# Patient Record
Sex: Female | Born: 1950 | Race: White | Hispanic: No | Marital: Married | Smoking: Former smoker
Health system: Southern US, Community
[De-identification: ages and names within clinical notes are randomized; demographics above are authoritative.]

## PROBLEM LIST (undated history)

## (undated) DIAGNOSIS — G43909 Migraine, unspecified, not intractable, without status migrainosus: Secondary | ICD-10-CM

## (undated) DIAGNOSIS — E785 Hyperlipidemia, unspecified: Secondary | ICD-10-CM

## (undated) DIAGNOSIS — E079 Disorder of thyroid, unspecified: Secondary | ICD-10-CM

## (undated) DIAGNOSIS — D649 Anemia, unspecified: Secondary | ICD-10-CM

## (undated) DIAGNOSIS — N200 Calculus of kidney: Secondary | ICD-10-CM

## (undated) HISTORY — PX: KNEE SURGERY: SHX244

## (undated) HISTORY — DX: Migraine, unspecified, not intractable, without status migrainosus: G43.909

## (undated) HISTORY — DX: Disorder of thyroid, unspecified: E07.9

## (undated) HISTORY — DX: Anemia, unspecified: D64.9

## (undated) HISTORY — DX: Hyperlipidemia, unspecified: E78.5

---

## 1997-08-10 ENCOUNTER — Ambulatory Visit (HOSPITAL_COMMUNITY): Admission: RE | Admit: 1997-08-10 | Discharge: 1997-08-10 | Payer: Self-pay | Admitting: Sports Medicine

## 1999-09-27 ENCOUNTER — Ambulatory Visit (HOSPITAL_COMMUNITY): Admission: RE | Admit: 1999-09-27 | Discharge: 1999-09-27 | Payer: Self-pay | Admitting: Family Medicine

## 1999-09-27 ENCOUNTER — Encounter: Payer: Self-pay | Admitting: Family Medicine

## 2000-02-14 ENCOUNTER — Other Ambulatory Visit: Admission: RE | Admit: 2000-02-14 | Discharge: 2000-02-14 | Payer: Self-pay | Admitting: Obstetrics and Gynecology

## 2000-03-30 ENCOUNTER — Emergency Department (HOSPITAL_COMMUNITY): Admission: EM | Admit: 2000-03-30 | Discharge: 2000-03-30 | Payer: Self-pay | Admitting: Internal Medicine

## 2000-03-30 ENCOUNTER — Encounter: Payer: Self-pay | Admitting: Emergency Medicine

## 2002-01-10 ENCOUNTER — Encounter (INDEPENDENT_AMBULATORY_CARE_PROVIDER_SITE_OTHER): Payer: Self-pay

## 2002-01-10 ENCOUNTER — Ambulatory Visit (HOSPITAL_COMMUNITY): Admission: RE | Admit: 2002-01-10 | Discharge: 2002-01-10 | Payer: Self-pay | Admitting: Gastroenterology

## 2003-06-09 ENCOUNTER — Ambulatory Visit (HOSPITAL_COMMUNITY): Admission: RE | Admit: 2003-06-09 | Discharge: 2003-06-09 | Payer: Self-pay | Admitting: Family Medicine

## 2004-05-03 ENCOUNTER — Emergency Department (HOSPITAL_COMMUNITY): Admission: EM | Admit: 2004-05-03 | Discharge: 2004-05-03 | Payer: Self-pay | Admitting: Emergency Medicine

## 2006-12-29 ENCOUNTER — Other Ambulatory Visit: Admission: RE | Admit: 2006-12-29 | Discharge: 2006-12-29 | Payer: Self-pay | Admitting: Family Medicine

## 2010-01-22 ENCOUNTER — Other Ambulatory Visit
Admission: RE | Admit: 2010-01-22 | Discharge: 2010-01-22 | Payer: Self-pay | Source: Home / Self Care | Admitting: Family Medicine

## 2010-05-31 NOTE — Op Note (Signed)
   [name], Katherine Morton                      ACCOUNT NO.:  [id number]   MEDICAL RECORD NO.:  [id number]                   PATIENT TYPE:  AMB   LOCATION:  ENDO                                 FACILITY:  [facility]   PHYSICIAN:  John C. Madilyn Fireman, M.D.                 DATE OF BIRTH:  [birth year]   DATE OF PROCEDURE:  DATE OF DISCHARGE:                                 OPERATIVE REPORT   PROCEDURE:  Colonoscopy.   INDICATIONS FOR PROCEDURE:  Family history of colon cancer in a first degree  relative in a 60 year old patient with no recent colon screening.   DESCRIPTION OF PROCEDURE:  The patient was placed in the left lateral  decubitus position and placed on the pulse monitor with continuous low flow  oxygen delivered by nasal cannula. She was sedated with 87.5 mg IV fentanyl  and 7 mg of IV Versed.   The Olympus video colonoscope was inserted into the rectum and advanced to  the cecum, confirmed by transillumination of McBurney's point and  visualization of the ileocecal valve and appendiceal orifice.  The prep was  excellent.   The cecum, ascending  and transverse colon all appeared normal with no  masses, polyps, diverticula or other mucosal abnormalities. In the  descending and sigmoid colon there were seen several scattered diverticula  and no other abnormalities. In the distal sigmoid colon there was a 1 cm  sessile polyp which was removed by snare. The remainder of the sigmoid and  the rectum  appeared normal down to the anus where retroflexed view revealed  no obvious internal hemorrhoids.   The colonoscope was then withdrawn and the patient was returned to the  recovery room in stable condition. She tolerated the procedure well and  there were no immediate complications.   IMPRESSION:  Sigmoid colon polyp.  Basically normal study with the exception of small internal hemorrhoids and  no stigma of hemorrhage.   PLAN:  Await histology to determine interval for next  colonoscopy.                                                John C. Madilyn Fireman, M.D.    [facility]MED[facility]  D:  01/10/2002  T:  01/10/2002  Job:  [id number]   cc:   Stacie Acres. White, M.D.  [address]  [place]  [place] [address]  Fax: [phone]

## 2010-08-02 ENCOUNTER — Other Ambulatory Visit: Payer: Self-pay | Admitting: Family Medicine

## 2010-08-09 ENCOUNTER — Ambulatory Visit
Admission: RE | Admit: 2010-08-09 | Discharge: 2010-08-09 | Disposition: A | Discharge status: Home / Self Care | Payer: BC Managed Care – PPO | Source: Ambulatory Visit | Attending: Family Medicine | Admitting: Family Medicine

## 2010-08-20 ENCOUNTER — Other Ambulatory Visit: Payer: Self-pay | Admitting: Family Medicine

## 2010-08-26 ENCOUNTER — Ambulatory Visit (INDEPENDENT_AMBULATORY_CARE_PROVIDER_SITE_OTHER): Payer: BC Managed Care – PPO | Admitting: General Surgery

## 2010-08-28 ENCOUNTER — Encounter (INDEPENDENT_AMBULATORY_CARE_PROVIDER_SITE_OTHER): Payer: Self-pay | Admitting: General Surgery

## 2010-08-29 ENCOUNTER — Ambulatory Visit (INDEPENDENT_AMBULATORY_CARE_PROVIDER_SITE_OTHER): Payer: BC Managed Care – PPO | Admitting: Surgery

## 2010-09-05 ENCOUNTER — Encounter (INDEPENDENT_AMBULATORY_CARE_PROVIDER_SITE_OTHER): Payer: Self-pay | Admitting: General Surgery

## 2010-09-05 ENCOUNTER — Ambulatory Visit (INDEPENDENT_AMBULATORY_CARE_PROVIDER_SITE_OTHER): Payer: BC Managed Care – PPO | Admitting: General Surgery

## 2010-09-05 VITALS — BP 108/80 | HR 64 | Temp 96.6°F | Ht 64.0 in | Wt 146.6 lb

## 2010-09-05 DIAGNOSIS — K8010 Calculus of gallbladder with chronic cholecystitis without obstruction: Secondary | ICD-10-CM

## 2010-09-05 NOTE — Progress Notes (Signed)
Chief Complaint  Patient presents with  . Other    Eval of gallstone 2.1 cm    HPI Katherine Morton is a 60 y.o. female.    This very pleasant patient is referred to me by Dr. Lurena Joiner for consultation and management of symptomatic gallstones.  The patient states that for the past 4 years she has had intermittent episodes of epigastric and right upper quadrant pain. These will sometimes lasts for a few days and then resolve. She has some nausea and some anorexia but does not vomit. Perhaps some of the episodes are stimulated by fatty foods. She is admittedly stoic. No fever or chills. Weight is stable. 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  She presented for evaluation on July 20 and saw Dr. Nicholos Johns. Perhaps she has been having slightly more episodes recently but nothing severe. Gallbladder ultrasound was performed which is read as showing a solitary 2.1 cm gallstone has a small renal cyst. No other abnormality was mentioned.  Past history is significant for a cesarean section, hyperlipidemia, thyroid disease, and headaches.  Family. history is significant for colon cancer in her father and gallstones in the mother.  The patient is asymptomatic today. HPI  Past Medical History  Diagnosis Date  . Hyperlipidemia   . Thyroid disease   . Migraine headache   . Anemia     Past Surgical History  Procedure Date  . Knee surgery     History reviewed. No pertinent family history.  Social History History  Substance Use Topics  . Smoking status: Former Games developer  . Smokeless tobacco: Not on file  . Alcohol Use: Yes    Allergies  Allergen Reactions  . Maxalt (Rizatriptan Benzoate) Nausea And Vomiting  . Penicillins Nausea And Vomiting  . Codeine Anxiety    Current Outpatient Prescriptions  Medication Sig Dispense Refill  . Cholecalciferol (VITAMIN D PO) Take by mouth daily.        Vilma Prader Thistle (LIVER & KIDNEY  CLEANSER PO) Take by mouth daily.        . cyclobenzaprine (FLEXERIL) 10 MG tablet Take 5 mg by mouth 3 (three) times daily as needed.        Marland Kitchen KRILL OIL PO Take by mouth daily.        Marland Kitchen LORazepam (ATIVAN) 0.5 MG tablet Take 0.5 mg by mouth as needed.       . metaxalone (SKELAXIN) 800 MG tablet Take 400 mg by mouth as needed.       . Multiple Vitamin (MULTIVITAMIN) capsule Take 1 capsule by mouth daily.        . naproxen (NAPROSYN) 125 MG/5ML suspension Take by mouth as needed.       . Red Yeast Rice 600 MG CAPS Take by mouth.        . thyroid (ARMOUR) 90 MG tablet Take 90 mg by mouth daily.          Review of Systems Review of Systems  Constitutional: Negative.   HENT: Negative for hearing loss, ear pain, nosebleeds, congestion, sore throat, tinnitus and ear discharge.   Eyes: Negative.   Respiratory: Negative.  Negative for stridor.   Cardiovascular: Negative.   Gastrointestinal: Positive for nausea and abdominal pain. Negative for heartburn, vomiting, diarrhea, constipation, blood in stool and melena.  Genitourinary: Negative.   Musculoskeletal: Negative.   Skin: Negative.   Neurological: Positive for headaches.  Endo/Heme/Allergies: Negative.   Psychiatric/Behavioral: Negative.  Blood pressure 108/80, pulse 64, temperature 96.6 F (35.9 C), temperature source Temporal, height 5\' 4"  (1.626 m), weight 146 lb 9.6 oz (66.497 kg).  Physical Exam Physical Exam  Constitutional: She is oriented to person, place, and time. She appears well-developed and well-nourished. No distress.  HENT:  Head: Normocephalic and atraumatic.  Nose: Nose normal.  Mouth/Throat: No oropharyngeal exudate.  Eyes: Conjunctivae and EOM are normal. Left eye exhibits no discharge. No scleral icterus.  Neck: Normal range of motion. Neck supple. No JVD present. No tracheal deviation present. No thyromegaly present.  Cardiovascular: Normal rate, regular rhythm, normal heart sounds and intact distal pulses.     No murmur heard. Respiratory: Effort normal and breath sounds normal. No respiratory distress. She has no wheezes. She has no rales. She exhibits no tenderness.  GI: Soft. Bowel sounds are normal. She exhibits no distension and no mass. There is no tenderness. There is no rebound and no guarding.    Musculoskeletal: Normal range of motion. She exhibits no edema and no tenderness.  Lymphadenopathy:    She has no cervical adenopathy.  Neurological: She is alert and oriented to person, place, and time. She exhibits normal muscle tone. Coordination normal.  Skin: Skin is warm and dry. No rash noted. She is not diaphoretic. No erythema. No pallor.  Psychiatric: She has a normal mood and affect. Her behavior is normal. Judgment and thought content normal.     Data Reviewed  I have reviewed her ultrasound, colonoscopy in 2008, and Dr. Benjaman Pott office notes. Assessment    Chronic cholecystitis with cholelithiasis. She has been having biliary colic for 3-4 years, and perhaps it is getting a little bit worse. There have been no complications to date.  Status post cesarean section.  History of anemia.  History of migraine headaches.    Plan    We had a long discussion about biliary tract disease. We discussed the pathophysiology of her gallbladder disease. We discussed the indications and details of cholecystectomy. Risks and complications were outlined, including but not limited to bleeding, infection, conversion to open laparotomy, bile leak, injury to adjacent organs such as the intestine remained bile duct is major reconstructive surgery, cardiac pulmonary and thromboembolic problems.. At this time all of her questions are answered. She seems to understand these issues well.  She is very busy and would like to postpone her surgery until after the furniture market if possible. I told her this was reasonable unless her symptoms accelerated. She is aware of the complications of secondary  bacterial infection, obstructive jaundice, and pancreatitis. I have told her that it is in her best interest to have an elective cholecystectomy.  She will call her back when she is ready to schedule surgery. Low fat diet is advised.      Opha Mcghee M 09/05/2010, 9:40 AM

## 2010-09-05 NOTE — Patient Instructions (Signed)
You will need to have a laparoscopic cholecystectomy at some point because of your continuing gallbladder symptoms. At this point in time it is not an emergency, but I advise that this be done in the future at your  earliest convenience. Please call back to schedule the surgery when you are ready. A low-fat diet is advised in the meantime.

## 2010-12-02 ENCOUNTER — Other Ambulatory Visit: Payer: Self-pay | Admitting: Family Medicine

## 2010-12-02 DIAGNOSIS — R52 Pain, unspecified: Secondary | ICD-10-CM

## 2010-12-03 ENCOUNTER — Ambulatory Visit
Admission: RE | Admit: 2010-12-03 | Discharge: 2010-12-03 | Disposition: A | Discharge status: Home / Self Care | Payer: BC Managed Care – PPO | Source: Ambulatory Visit | Attending: Family Medicine | Admitting: Family Medicine

## 2010-12-03 DIAGNOSIS — R52 Pain, unspecified: Secondary | ICD-10-CM

## 2011-09-20 ENCOUNTER — Emergency Department (HOSPITAL_BASED_OUTPATIENT_CLINIC_OR_DEPARTMENT_OTHER): Payer: BC Managed Care – PPO

## 2011-09-20 ENCOUNTER — Emergency Department (HOSPITAL_BASED_OUTPATIENT_CLINIC_OR_DEPARTMENT_OTHER)
Admission: EM | Admit: 2011-09-20 | Discharge: 2011-09-20 | Disposition: A | Discharge status: Home / Self Care | Payer: BC Managed Care – PPO | Attending: Emergency Medicine | Admitting: Emergency Medicine

## 2011-09-20 ENCOUNTER — Encounter (HOSPITAL_BASED_OUTPATIENT_CLINIC_OR_DEPARTMENT_OTHER): Payer: Self-pay | Admitting: Emergency Medicine

## 2011-09-20 DIAGNOSIS — E785 Hyperlipidemia, unspecified: Secondary | ICD-10-CM | POA: Insufficient documentation

## 2011-09-20 DIAGNOSIS — K8020 Calculus of gallbladder without cholecystitis without obstruction: Secondary | ICD-10-CM | POA: Insufficient documentation

## 2011-09-20 DIAGNOSIS — N23 Unspecified renal colic: Secondary | ICD-10-CM | POA: Insufficient documentation

## 2011-09-20 DIAGNOSIS — E079 Disorder of thyroid, unspecified: Secondary | ICD-10-CM | POA: Insufficient documentation

## 2011-09-20 LAB — URINALYSIS, ROUTINE W REFLEX MICROSCOPIC
Nitrite: NEGATIVE
Specific Gravity, Urine: 1.017 (ref 1.005–1.030)
pH: 7.5 (ref 5.0–8.0)

## 2011-09-20 LAB — URINE MICROSCOPIC-ADD ON

## 2011-09-20 MED ORDER — ONDANSETRON 8 MG PO TBDP: 8.0000 mg | ORAL_TABLET | Freq: Three times a day (TID) | ORAL | Status: AC | PRN

## 2011-09-20 MED ORDER — ONDANSETRON HCL 4 MG/2ML IJ SOLN
4.0000 mg | Freq: Once | INTRAMUSCULAR | Status: AC
  Administered 2011-09-20: 4 mg via INTRAVENOUS
  Filled 2011-09-20: qty 2

## 2011-09-20 MED ORDER — KETOROLAC TROMETHAMINE 15 MG/ML IJ SOLN
15.0000 mg | Freq: Once | INTRAMUSCULAR | Status: AC
  Administered 2011-09-20: 15 mg via INTRAVENOUS
  Filled 2011-09-20: qty 1

## 2011-09-20 MED ORDER — SODIUM CHLORIDE 0.9 % IV SOLN
INTRAVENOUS | Status: DC
  Administered 2011-09-20: 05:00:00 via INTRAVENOUS

## 2011-09-20 MED ORDER — NAPROXEN SODIUM 220 MG PO TABS: ORAL_TABLET | ORAL | Status: DC

## 2011-09-20 MED ORDER — FENTANYL CITRATE 0.05 MG/ML IJ SOLN
100.0000 ug | Freq: Once | INTRAMUSCULAR | Status: AC
  Administered 2011-09-20: 100 ug via INTRAVENOUS
  Filled 2011-09-20: qty 2

## 2011-09-20 MED ORDER — HYDROMORPHONE HCL 2 MG PO TABS: 2.0000 mg | ORAL_TABLET | ORAL | Status: AC | PRN

## 2011-09-20 MED ORDER — HYDROMORPHONE HCL PF 1 MG/ML IJ SOLN: 1.0000 mg | Freq: Once | INTRAMUSCULAR | Status: DC

## 2011-09-20 NOTE — ED Notes (Signed)
Pt reports abd pain starting about an hour ago, rt flank around to front; hx of kidney stones; feels similar; also tender in LLQ where she's been diagnosed with an ovarian cyst;

## 2011-09-20 NOTE — ED Provider Notes (Signed)
History     CSN: [id number]  Arrival date & time 09/20/11  4098   First MD Initiated Contact with Patient 09/20/11 0441      Chief Complaint  Patient presents with  . Abdominal Pain    (Consider location/radiation/quality/duration/timing/severity/associated sxs/prior treatment) HPI This is a 61 year old white female with a remote history of kidney stones. She awoke about an hour and half ago with severe left abdominal pain radiating to her left flank. She's had nausea and some mild vomiting. She is having difficulty finding a comfortable position as nothing improves the pain. She was recently diagnosed with a left ovarian cyst.  Past Medical History  Diagnosis Date  . Hyperlipidemia   . Thyroid disease   . Migraine headache   . Anemia     Past Surgical History  Procedure Date  . Knee surgery     No family history on file.  History  Substance Use Topics  . Smoking status: Former Games developer  . Smokeless tobacco: Not on file  . Alcohol Use: Yes    OB History    Grav Para Term Preterm Abortions TAB SAB Ect Mult Living                  Review of Systems  All other systems reviewed and are negative.    Allergies  Maxalt; Penicillins; and Codeine  Home Medications   Current Outpatient Rx  Name Route Sig Dispense Refill  . VITAMIN D PO Oral Take by mouth daily.      Marland Kitchen LIVER & KIDNEY CLEANSER PO Oral Take by mouth daily.      . CYCLOBENZAPRINE HCL 10 MG PO TABS Oral Take 5 mg by mouth 3 (three) times daily as needed.      Marland Kitchen KRILL OIL PO Oral Take by mouth daily.      Marland Kitchen LORAZEPAM 0.5 MG PO TABS Oral Take 0.5 mg by mouth as needed.     Marland Kitchen METAXALONE 800 MG PO TABS Oral Take 400 mg by mouth as needed.     . MULTIVITAMINS PO CAPS Oral Take 1 capsule by mouth daily.      Marland Kitchen NAPROXEN 125 MG/5ML PO SUSP Oral Take by mouth as needed.     . RED YEAST RICE 600 MG PO CAPS Oral Take by mouth.      . THYROID 90 MG PO TABS Oral Take 90 mg by mouth daily.        Pulse 74  Temp  97.6 F (36.4 C) (Oral)  SpO2 100%  Physical Exam General: Well-developed, well-nourished female in obvious discomfort; appearance consistent with age of record HENT: normocephalic, atraumatic Eyes: Normal appearance Neck: supple Heart: regular rate and rhythm Lungs: clear to auscultation bilaterally Abdomen: soft; nondistended; mild left upper quadrant tenderness; bowel sounds present GU: No flank tenderness Extremities: No deformity; full range of motion; no edema Neurologic: Awake, alert; motor function intact in all extremities and symmetric; no facial droop Skin: Warm and dry Psychiatric: Anxious; agitated due to pain    ED Course  Procedures (including critical care time)     MDM   Nursing notes and vitals signs, including pulse oximetry, reviewed.  Summary of this visit's results, reviewed by myself:  Labs:  Results for orders placed during the hospital encounter of 09/20/11  URINALYSIS, ROUTINE W REFLEX MICROSCOPIC      Component Value Range   Color, Urine YELLOW  YELLOW   APPearance CLOUDY (*) CLEAR   Specific Gravity, Urine 1.017  1.005 - 1.030   pH 7.5  5.0 - 8.0   Glucose, UA NEGATIVE  NEGATIVE mg/dL   Hgb urine dipstick LARGE (*) NEGATIVE   Bilirubin Urine NEGATIVE  NEGATIVE   Ketones, ur 15 (*) NEGATIVE mg/dL   Protein, ur NEGATIVE  NEGATIVE mg/dL   Urobilinogen, UA 1.0  0.0 - 1.0 mg/dL   Nitrite NEGATIVE  NEGATIVE   Leukocytes, UA MODERATE (*) NEGATIVE  URINE MICROSCOPIC-ADD ON      Component Value Range   Squamous Epithelial / LPF FEW (*) RARE   WBC, UA 3-6  <3 WBC/hpf   RBC / HPF TOO NUMEROUS TO COUNT  <3 RBC/hpf   Bacteria, UA FEW (*) RARE   Urine-Other AMORPHOUS URATES/PHOSPHATES      Imaging Studies: Ct Abdomen Pelvis Wo Contrast  09/20/2011  *RADIOLOGY REPORT*  Clinical Data: Left flank pain.  Left lower quadrant tenderness. History of kidney stones.  CT ABDOMEN AND PELVIS WITHOUT CONTRAST  Technique:  Multidetector CT imaging of the  abdomen and pelvis was performed following the standard protocol without intravenous contrast.  Comparison: 12/03/2010  Findings: Slight fibrosis in the lung bases.  4 mm stone in the proximal left ureter at the level of L3-4 with proximal pyelocaliectasis and ureterectasis.  Distal ureteral compression.  Periureteral and pararenal stranding.  Changes are consistent with moderate obstruction.  Multiple punctate nonobstructing stones within the left kidney.  Parenchymal cysts in both kidneys.  No stones or pyelocaliectasis or ureterectasis on the right.  Bladder wall is not thickened.  No bladder stones are visualized.  Cholelithiasis, stable.  Unenhanced appearance of the liver, spleen, pancreas, adrenal glands, abdominal aorta, and retroperitoneal lymph nodes is unremarkable.  No free fluid or free air in the abdomen.  The stomach, small bowel, and colon are not abnormally distended.  Pelvis:  Diverticulosis of the sigmoid colon without diverticulitis.  The appendix is normal.  Uterus is not enlarged. Large cystic structure in the left adnexa measuring 6.7 x 5.1 cm. This is decreased in size since the previous study and likely represents a ovarian cyst.  No significant pelvic lymphadenopathy. Normal alignment of the lumbar vertebrae.  IMPRESSION: 4 mm stone in the proximal left ureter with moderate obstruction. Nonobstructing intrarenal stones on the left.  Cholelithiasis. Large cyst in the left adnexa is decreasing in size since previous study.   Original Report Authenticated By: Marlon Pel, M.D.       5:17 AM Pain significantly improved with IV meds.         Hanley Seamen, MD 09/20/11 [phone]

## 2011-09-24 LAB — URINE CULTURE: Colony Count: >=100000

## 2011-09-25 NOTE — ED Notes (Signed)
Chart sent to EDP office for review.  + Urine Culture

## 2011-09-28 ENCOUNTER — Telehealth (HOSPITAL_COMMUNITY): Payer: Self-pay | Admitting: Emergency Medicine

## 2011-09-28 NOTE — ED Notes (Signed)
Chart returned from EDP office. Prescribed Trimethoprim/Sulfamethoxazole 160/800 mg. One tablet PO BID. #6. No refills. Prescribed by Emilia Beck PA-C.

## 2012-01-13 ENCOUNTER — Other Ambulatory Visit: Payer: Self-pay | Admitting: Family Medicine

## 2012-01-13 DIAGNOSIS — R109 Unspecified abdominal pain: Secondary | ICD-10-CM

## 2012-01-15 ENCOUNTER — Ambulatory Visit
Admission: RE | Admit: 2012-01-15 | Discharge: 2012-01-15 | Disposition: A | Discharge status: Home / Self Care | Payer: BC Managed Care – PPO | Source: Ambulatory Visit | Attending: Family Medicine | Admitting: Family Medicine

## 2012-01-15 DIAGNOSIS — R109 Unspecified abdominal pain: Secondary | ICD-10-CM

## 2012-07-13 ENCOUNTER — Other Ambulatory Visit (HOSPITAL_COMMUNITY)
Admission: RE | Admit: 2012-07-13 | Discharge: 2012-07-13 | Disposition: A | Discharge status: Home / Self Care | Payer: BC Managed Care – PPO | Source: Ambulatory Visit | Attending: Obstetrics and Gynecology | Admitting: Obstetrics and Gynecology

## 2012-07-13 ENCOUNTER — Other Ambulatory Visit: Payer: Self-pay | Admitting: Obstetrics and Gynecology

## 2012-07-13 ENCOUNTER — Other Ambulatory Visit: Payer: Self-pay | Admitting: Nurse Practitioner

## 2012-07-13 DIAGNOSIS — Z01419 Encounter for gynecological examination (general) (routine) without abnormal findings: Secondary | ICD-10-CM | POA: Insufficient documentation

## 2012-07-13 DIAGNOSIS — Z1231 Encounter for screening mammogram for malignant neoplasm of breast: Secondary | ICD-10-CM

## 2012-07-13 DIAGNOSIS — Z1151 Encounter for screening for human papillomavirus (HPV): Secondary | ICD-10-CM | POA: Insufficient documentation

## 2012-08-11 ENCOUNTER — Ambulatory Visit: Payer: BC Managed Care – PPO

## 2012-08-17 ENCOUNTER — Encounter (HOSPITAL_BASED_OUTPATIENT_CLINIC_OR_DEPARTMENT_OTHER): Payer: Self-pay | Admitting: Emergency Medicine

## 2012-08-17 ENCOUNTER — Emergency Department (HOSPITAL_BASED_OUTPATIENT_CLINIC_OR_DEPARTMENT_OTHER): Payer: BC Managed Care – PPO

## 2012-08-17 ENCOUNTER — Emergency Department (HOSPITAL_BASED_OUTPATIENT_CLINIC_OR_DEPARTMENT_OTHER)
Admission: EM | Admit: 2012-08-17 | Discharge: 2012-08-17 | Disposition: A | Discharge status: Home / Self Care | Payer: BC Managed Care – PPO | Attending: Emergency Medicine | Admitting: Emergency Medicine

## 2012-08-17 DIAGNOSIS — E079 Disorder of thyroid, unspecified: Secondary | ICD-10-CM | POA: Insufficient documentation

## 2012-08-17 DIAGNOSIS — Z862 Personal history of diseases of the blood and blood-forming organs and certain disorders involving the immune mechanism: Secondary | ICD-10-CM | POA: Insufficient documentation

## 2012-08-17 DIAGNOSIS — G43909 Migraine, unspecified, not intractable, without status migrainosus: Secondary | ICD-10-CM | POA: Insufficient documentation

## 2012-08-17 DIAGNOSIS — Z79899 Other long term (current) drug therapy: Secondary | ICD-10-CM | POA: Insufficient documentation

## 2012-08-17 DIAGNOSIS — N201 Calculus of ureter: Secondary | ICD-10-CM

## 2012-08-17 DIAGNOSIS — E785 Hyperlipidemia, unspecified: Secondary | ICD-10-CM | POA: Insufficient documentation

## 2012-08-17 DIAGNOSIS — Z87442 Personal history of urinary calculi: Secondary | ICD-10-CM | POA: Insufficient documentation

## 2012-08-17 DIAGNOSIS — Z880 Allergy status to penicillin: Secondary | ICD-10-CM | POA: Insufficient documentation

## 2012-08-17 DIAGNOSIS — R110 Nausea: Secondary | ICD-10-CM | POA: Insufficient documentation

## 2012-08-17 DIAGNOSIS — N1330 Unspecified hydronephrosis: Secondary | ICD-10-CM | POA: Insufficient documentation

## 2012-08-17 DIAGNOSIS — Z87891 Personal history of nicotine dependence: Secondary | ICD-10-CM | POA: Insufficient documentation

## 2012-08-17 HISTORY — DX: Calculus of kidney: N20.0

## 2012-08-17 LAB — URINE MICROSCOPIC-ADD ON

## 2012-08-17 LAB — COMPREHENSIVE METABOLIC PANEL
AST: 32 U/L (ref 0–37)
CO2: 21 mEq/L (ref 19–32)
Calcium: 10.4 mg/dL (ref 8.4–10.5)
Creatinine, Ser: 0.8 mg/dL (ref 0.50–1.10)
GFR calc Af Amer: >90 mL/min (ref >90)
GFR calc non Af Amer: 78 mL/min — ABNORMAL LOW (ref >90)

## 2012-08-17 LAB — CBC WITH DIFFERENTIAL/PLATELET
Hemoglobin: 15.1 g/dL — ABNORMAL HIGH (ref 12.0–15.0)
Lymphocytes Relative: 30 % (ref 12–46)
Lymphs Abs: 2.5 10*3/uL (ref 0.7–4.0)
MCH: 31 pg (ref 26.0–34.0)
Monocytes Relative: 9 % (ref 3–12)
Neutro Abs: 4.8 10*3/uL (ref 1.7–7.7)
Neutrophils Relative %: 59 % (ref 43–77)
RBC: 4.87 MIL/uL (ref 3.87–5.11)

## 2012-08-17 LAB — URINALYSIS, ROUTINE W REFLEX MICROSCOPIC
Glucose, UA: NEGATIVE mg/dL
Ketones, ur: NEGATIVE mg/dL
Protein, ur: 30 mg/dL — AB

## 2012-08-17 MED ORDER — OXYCODONE-ACETAMINOPHEN 5-325 MG PO TABS
2.0000 | ORAL_TABLET | Freq: Once | ORAL | Status: AC
  Administered 2012-08-17: 2 via ORAL
  Filled 2012-08-17 (×2): qty 2

## 2012-08-17 MED ORDER — FENTANYL CITRATE 0.05 MG/ML IJ SOLN
50.0000 ug | Freq: Once | INTRAMUSCULAR | Status: AC
  Administered 2012-08-17: 50 ug via INTRAVENOUS

## 2012-08-17 MED ORDER — CIPROFLOXACIN HCL 500 MG PO TABS: 500.0000 mg | ORAL_TABLET | Freq: Two times a day (BID) | ORAL | Status: DC

## 2012-08-17 MED ORDER — OXYCODONE-ACETAMINOPHEN 5-325 MG PO TABS: 1.0000 | ORAL_TABLET | Freq: Four times a day (QID) | ORAL | Status: DC | PRN

## 2012-08-17 MED ORDER — ONDANSETRON HCL 4 MG/2ML IJ SOLN
INTRAMUSCULAR | Status: AC
  Filled 2012-08-17: qty 2

## 2012-08-17 MED ORDER — FENTANYL CITRATE 0.05 MG/ML IJ SOLN
INTRAMUSCULAR | Status: AC
  Filled 2012-08-17: qty 2

## 2012-08-17 MED ORDER — HYDROMORPHONE HCL PF 1 MG/ML IJ SOLN
1.0000 mg | Freq: Once | INTRAMUSCULAR | Status: AC
  Administered 2012-08-17: 1 mg via INTRAVENOUS

## 2012-08-17 MED ORDER — SODIUM CHLORIDE 0.9 % IV BOLUS (SEPSIS)
1000.0000 mL | Freq: Once | INTRAVENOUS | Status: AC
  Administered 2012-08-17: 1000 mL via INTRAVENOUS

## 2012-08-17 MED ORDER — TAMSULOSIN HCL 0.4 MG PO CAPS: 0.4000 mg | ORAL_CAPSULE | Freq: Every day | ORAL | Status: DC

## 2012-08-17 MED ORDER — ONDANSETRON HCL 4 MG/2ML IJ SOLN
4.0000 mg | Freq: Once | INTRAMUSCULAR | Status: AC
  Administered 2012-08-17: 4 mg via INTRAVENOUS

## 2012-08-17 MED ORDER — ONDANSETRON HCL 4 MG PO TABS: 4.0000 mg | ORAL_TABLET | Freq: Four times a day (QID) | ORAL | Status: DC | PRN

## 2012-08-17 MED ORDER — HYDROMORPHONE HCL PF 1 MG/ML IJ SOLN
INTRAMUSCULAR | Status: AC
  Filled 2012-08-17: qty 1

## 2012-08-17 NOTE — ED Notes (Signed)
MD at bedside. 

## 2012-08-17 NOTE — ED Provider Notes (Signed)
CSN: [id number]     Arrival date & time 08/17/12  0119 History     First MD Initiated Contact with Patient 08/17/12 [phone]     Chief Complaint  Patient presents with  . Flank Pain   HPI Katherine Morton is a 62 y.o. female presents with acute onset left flank pain starting about 2300 this evening associated with nausea. Patient has a history of kidney stones in the past not requiring surgical intervention for which she has seen Dr. Margarita Grizzle of [facility]. He should pain is severe, cramping, sharp, left flank, radiates downward into the abdomen, no associated dysuria, frequency, fever, chills.  No chest pain, shortness of breath. Past Medical History  Diagnosis Date  . Hyperlipidemia   . Thyroid disease   . Migraine headache   . Anemia   . Kidney stone    Past Surgical History  Procedure Laterality Date  . Knee surgery     No family history on file. History  Substance Use Topics  . Smoking status: Former Games developer  . Smokeless tobacco: Not on file  . Alcohol Use: Yes   OB History   Grav Para Term Preterm Abortions TAB SAB Ect Mult Living                 Review of Systems At least 10pt or greater review of systems completed and are negative except where specified in the HPI.  Allergies  Maxalt; Penicillins; and Codeine  Home Medications   Current Outpatient Rx  Name  Route  Sig  Dispense  Refill  . Cholecalciferol (VITAMIN D PO)   Oral   Take by mouth daily.           . citalopram (CELEXA) 10 MG tablet   Oral   Take by mouth daily.         . cyclobenzaprine (FLEXERIL) 10 MG tablet   Oral   Take 5 mg by mouth 3 (three) times daily as needed.           Marland Kitchen KRILL OIL PO   Oral   Take by mouth daily.           Marland Kitchen LORazepam (ATIVAN) 0.5 MG tablet   Oral   Take 0.5 mg by mouth as needed.          . Multiple Vitamin (MULTIVITAMIN) capsule   Oral   Take 1 capsule by mouth daily.           . rosuvastatin (CRESTOR) 10 MG tablet   Oral   Take by  mouth daily.         Marland Kitchen thyroid (ARMOUR) 90 MG tablet   Oral   Take 90 mg by mouth daily.           Vilma Prader Thistle (LIVER & KIDNEY CLEANSER PO)   Oral   Take by mouth daily.           . metaxalone (SKELAXIN) 800 MG tablet   Oral   Take 400 mg by mouth as needed.          . naproxen (NAPROSYN) 125 MG/5ML suspension   Oral   Take by mouth as needed.          . naproxen sodium (ALEVE) 220 MG tablet      Take 2 tablets every 12 hours until stone passes or otherwise instructed.         . Red Yeast Rice 600 MG CAPS   Oral   Take  by mouth.            BP 119/91  Pulse 84  Temp(Src) 97.9 F (36.6 C) (Oral)  Resp 24  SpO2 100% Physical Exam  Nursing notes reviewed.  Electronic medical record reviewed. VITAL SIGNS:   Filed Vitals:   08/17/12 0131  BP: 119/91  Pulse: 84  Temp: 97.9 F (36.6 C)  TempSrc: Oral  Resp: 24  SpO2: 100%   CONSTITUTIONAL: Awake, oriented, appears to be in severe pain, rocking back and forth on the gurney cannot get comfortable HENT: Atraumatic, normocephalic, oral mucosa pink and moist, airway patent. Nares patent without drainage. External ears normal. EYES: Conjunctiva clear, EOMI, PERRLA NECK: Trachea midline, non-tender, supple CARDIOVASCULAR: Normal heart rate, Normal rhythm, No murmurs, rubs, gallops PULMONARY/CHEST: Clear to auscultation, no rhonchi, wheezes, or rales. Symmetrical breath sounds. Non-tender. ABDOMINAL: Non-distended, soft, non-tender - no rebound or guarding.  BS normal. No CVA tenderness NEUROLOGIC: Non-focal, moving all four extremities, no gross sensory or motor deficits. EXTREMITIES: No clubbing, cyanosis, or edema SKIN: Warm, Dry, No erythema, No rash  ED Course   Procedures (including critical care time)  Labs Reviewed  URINALYSIS, ROUTINE W REFLEX MICROSCOPIC - Abnormal; Notable for the following:    APPearance TURBID (*)    Hgb urine dipstick LARGE (*)    Protein, ur 30 (*)     Leukocytes, UA MODERATE (*)    All other components within normal limits  COMPREHENSIVE METABOLIC PANEL - Abnormal; Notable for the following:    Glucose, Bld 141 (*)    ALT 38 (*)    GFR calc non Af Amer 78 (*)    All other components within normal limits  CBC WITH DIFFERENTIAL - Abnormal; Notable for the following:    Hemoglobin 15.1 (*)    All other components within normal limits  URINE MICROSCOPIC-ADD ON - Abnormal; Notable for the following:    Squamous Epithelial / LPF MANY (*)    Bacteria, UA MANY (*)    All other components within normal limits  URINE CULTURE   Ct Abdomen Pelvis Wo Contrast  08/17/2012   *RADIOLOGY REPORT*  Clinical Data: Left flank pain and nausea.  CT ABDOMEN AND PELVIS WITHOUT CONTRAST  Technique:  Multidetector CT imaging of the abdomen and pelvis was performed following the standard protocol without intravenous contrast.  Comparison: CT abdomen and pelvis 09/20/2011. Pelvic ultrasound 01/15/2012.  Findings: The lung bases are clear.  No pleural or pericardial effusion is identified.  The patient has moderately severe to severe left hydronephrosis due to a 0.5 cm left ureteral stone at the level of L3-4.  A punctate nonobstructing stone is identified in the upper pole of the left kidney with two punctate nonobstructing stone seen in the lower pole.  No right renal stones are identified. Low attenuating renal lesions compatible with cysts are unchanged.  Cystic lesion in the left ovary which had measured 6.7 x 5.1 cm on the prior CT today measures 5.4 x 2.7 cm.  The right ovary, uterus and urinary bladder are unremarkable.  A single gallstone is seen measuring 1.6 cm in diameter. There is no evidence of acute cholecystitis.  The liver, spleen, adrenal glands and pancreas appear normal.  There is some sigmoid diverticulosis without diverticulitis.  The colon is otherwise unremarkable.  The stomach, small bowel and appendix appear normal.  No lymphadenopathy or fluid is  identified.  There is no focal bony abnormality.  IMPRESSION:  1.  Moderately severe to severe left hydronephrosis  due to a 0.5 cm stone at the level of L3-4.  Three additional nonobstructing left renal stones are seen.  2.  Gallstone without evidence of cholecystitis.  3.  Cystic lesion left ovary as seen on prior exams.   Original Report Authenticated By: Holley Dexter, M.D.   1. Ureterolithiasis   2. Hydronephrosis of left kidney     MDM  Patient with prior history of ureteral lithiasis presents with left flank pain, for 0.6 mm stone in the left mid ureter, with moderate to moderately severe left hydronephrosis located about L3-L4. There are 3 other nonobstructing left renal stone seen. Discussed with Dr.Dahlstadt, will have patient followup in the office. Patient also has some white blood cells in the urine, this is likely contamination, however based on the fact that she does have a stone, we'll place the patient on ciprofloxacin prophylactically.  Discussed plan with patient, she is comfortable, does not need any more pain medicine at this time, she agrees accepts the medical plan as it's been dictated, will followup tomorrow with [facility]. I have answered her and her husband's questions to their satisfaction. Patient is discharged home stable and good condition.    Jones Skene, MD 08/17/12 [phone]

## 2012-08-17 NOTE — ED Notes (Signed)
Patient transported to CT 

## 2012-08-17 NOTE — ED Notes (Signed)
Pt c/o left flank pain onset 2300 with nausea

## 2012-08-18 LAB — URINE CULTURE: Colony Count: >=100000

## 2013-05-16 ENCOUNTER — Other Ambulatory Visit: Payer: Self-pay | Admitting: Family Medicine

## 2013-05-16 DIAGNOSIS — R1011 Right upper quadrant pain: Secondary | ICD-10-CM

## 2013-05-23 ENCOUNTER — Ambulatory Visit
Admission: RE | Admit: 2013-05-23 | Discharge: 2013-05-23 | Disposition: A | Discharge status: Home / Self Care | Payer: BC Managed Care – PPO | Source: Ambulatory Visit | Attending: Family Medicine | Admitting: Family Medicine

## 2013-05-23 DIAGNOSIS — R1011 Right upper quadrant pain: Secondary | ICD-10-CM

## 2013-05-23 MED ORDER — IOHEXOL 300 MG/ML  SOLN
100.0000 mL | Freq: Once | INTRAMUSCULAR | Status: AC | PRN
  Administered 2013-05-23: 100 mL via INTRAVENOUS

## 2013-06-13 ENCOUNTER — Ambulatory Visit (INDEPENDENT_AMBULATORY_CARE_PROVIDER_SITE_OTHER): Payer: BC Managed Care – PPO | Admitting: General Surgery

## 2013-09-16 ENCOUNTER — Inpatient Hospital Stay (HOSPITAL_COMMUNITY)
Admission: EM | Admit: 2013-09-16 | Discharge: 2013-09-18 | DRG: 392 | Disposition: A | Discharge status: Home / Self Care | Payer: BC Managed Care – PPO | Attending: Internal Medicine | Admitting: Internal Medicine

## 2013-09-16 ENCOUNTER — Emergency Department (HOSPITAL_COMMUNITY): Payer: BC Managed Care – PPO

## 2013-09-16 ENCOUNTER — Encounter (HOSPITAL_COMMUNITY): Payer: Self-pay | Admitting: Emergency Medicine

## 2013-09-16 DIAGNOSIS — E039 Hypothyroidism, unspecified: Secondary | ICD-10-CM | POA: Diagnosis present

## 2013-09-16 DIAGNOSIS — E785 Hyperlipidemia, unspecified: Secondary | ICD-10-CM | POA: Diagnosis present

## 2013-09-16 DIAGNOSIS — Z87891 Personal history of nicotine dependence: Secondary | ICD-10-CM | POA: Diagnosis not present

## 2013-09-16 DIAGNOSIS — R1032 Left lower quadrant pain: Secondary | ICD-10-CM | POA: Diagnosis not present

## 2013-09-16 DIAGNOSIS — Z79899 Other long term (current) drug therapy: Secondary | ICD-10-CM | POA: Diagnosis not present

## 2013-09-16 DIAGNOSIS — Q619 Cystic kidney disease, unspecified: Secondary | ICD-10-CM

## 2013-09-16 DIAGNOSIS — N289 Disorder of kidney and ureter, unspecified: Secondary | ICD-10-CM | POA: Diagnosis present

## 2013-09-16 DIAGNOSIS — K5792 Diverticulitis of intestine, part unspecified, without perforation or abscess without bleeding: Secondary | ICD-10-CM | POA: Diagnosis present

## 2013-09-16 DIAGNOSIS — K5732 Diverticulitis of large intestine without perforation or abscess without bleeding: Secondary | ICD-10-CM | POA: Diagnosis present

## 2013-09-16 LAB — URINALYSIS, ROUTINE W REFLEX MICROSCOPIC
Bilirubin Urine: NEGATIVE
GLUCOSE, UA: NEGATIVE mg/dL
Ketones, ur: NEGATIVE mg/dL
Nitrite: NEGATIVE
Protein, ur: NEGATIVE mg/dL
Specific Gravity, Urine: 1.02 (ref 1.005–1.030)
UROBILINOGEN UA: 0.2 mg/dL (ref 0.0–1.0)
pH: 6.5 (ref 5.0–8.0)

## 2013-09-16 LAB — URINE MICROSCOPIC-ADD ON

## 2013-09-16 LAB — COMPREHENSIVE METABOLIC PANEL
ALT: 22 U/L (ref 0–35)
AST: 22 U/L (ref 0–37)
Albumin: 3.8 g/dL (ref 3.5–5.2)
Alkaline Phosphatase: 90 U/L (ref 39–117)
Anion gap: 13 (ref 5–15)
BUN: 15 mg/dL (ref 6–23)
CALCIUM: 9.3 mg/dL (ref 8.4–10.5)
CO2: 24 meq/L (ref 19–32)
Chloride: 101 mEq/L (ref 96–112)
Creatinine, Ser: 0.76 mg/dL (ref 0.50–1.10)
GFR calc Af Amer: >90 mL/min (ref >90)
GFR, EST NON AFRICAN AMERICAN: 89 mL/min — AB (ref >90)
Glucose, Bld: 108 mg/dL — ABNORMAL HIGH (ref 70–99)
Potassium: 4.1 mEq/L (ref 3.7–5.3)
Sodium: 138 mEq/L (ref 137–147)
Total Bilirubin: 0.5 mg/dL (ref 0.3–1.2)
Total Protein: 7.6 g/dL (ref 6.0–8.3)

## 2013-09-16 LAB — CBC WITH DIFFERENTIAL/PLATELET
BASOS ABS: 0 10*3/uL (ref 0.0–0.1)
BASOS PCT: 0 % (ref 0–1)
EOS PCT: 1 % (ref 0–5)
Eosinophils Absolute: 0.1 10*3/uL (ref 0.0–0.7)
HEMATOCRIT: 41.1 % (ref 36.0–46.0)
Hemoglobin: 14.1 g/dL (ref 12.0–15.0)
Lymphocytes Relative: 12 % (ref 12–46)
Lymphs Abs: 1.2 10*3/uL (ref 0.7–4.0)
MCH: 31 pg (ref 26.0–34.0)
MCHC: 34.3 g/dL (ref 30.0–36.0)
MCV: 90.3 fL (ref 78.0–100.0)
MONO ABS: 0.6 10*3/uL (ref 0.1–1.0)
Monocytes Relative: 6 % (ref 3–12)
Neutro Abs: 8.4 10*3/uL — ABNORMAL HIGH (ref 1.7–7.7)
Neutrophils Relative %: 81 % — ABNORMAL HIGH (ref 43–77)
Platelets: 230 10*3/uL (ref 150–400)
RBC: 4.55 MIL/uL (ref 3.87–5.11)
RDW: 12.5 % (ref 11.5–15.5)
WBC: 10.3 10*3/uL (ref 4.0–10.5)

## 2013-09-16 LAB — LIPASE, BLOOD: LIPASE: 23 U/L (ref 11–59)

## 2013-09-16 MED ORDER — TRAMADOL HCL 50 MG PO TABS
50.0000 mg | ORAL_TABLET | Freq: Four times a day (QID) | ORAL | Status: DC | PRN
  Administered 2013-09-16 – 2013-09-17 (×5): 50 mg via ORAL
  Filled 2013-09-16 (×5): qty 1

## 2013-09-16 MED ORDER — METRONIDAZOLE IN NACL 5-0.79 MG/ML-% IV SOLN
500.0000 mg | Freq: Once | INTRAVENOUS | Status: AC
  Administered 2013-09-16: 500 mg via INTRAVENOUS
  Filled 2013-09-16: qty 100

## 2013-09-16 MED ORDER — IOHEXOL 300 MG/ML  SOLN
100.0000 mL | Freq: Once | INTRAMUSCULAR | Status: AC | PRN
  Administered 2013-09-16: 100 mL via INTRAVENOUS

## 2013-09-16 MED ORDER — HYDROMORPHONE HCL PF 1 MG/ML IJ SOLN
1.0000 mg | INTRAMUSCULAR | Status: DC | PRN
Start: 2013-09-16 — End: 2013-09-18
  Administered 2013-09-16: 1 mg via INTRAVENOUS
  Filled 2013-09-16: qty 1

## 2013-09-16 MED ORDER — METRONIDAZOLE IN NACL 5-0.79 MG/ML-% IV SOLN
500.0000 mg | Freq: Three times a day (TID) | INTRAVENOUS | Status: DC
  Administered 2013-09-16 – 2013-09-18 (×6): 500 mg via INTRAVENOUS
  Filled 2013-09-16 (×7): qty 100

## 2013-09-16 MED ORDER — CITALOPRAM HYDROBROMIDE 20 MG PO TABS: 20.0000 mg | ORAL_TABLET | Freq: Every day | ORAL | Status: DC

## 2013-09-16 MED ORDER — SODIUM CHLORIDE 0.9 % IV SOLN
INTRAVENOUS | Status: DC
  Administered 2013-09-17: 75 mL/h via INTRAVENOUS
  Administered 2013-09-18: 05:00:00 via INTRAVENOUS

## 2013-09-16 MED ORDER — LEVOFLOXACIN IN D5W 750 MG/150ML IV SOLN
750.0000 mg | Freq: Once | INTRAVENOUS | Status: AC
  Administered 2013-09-16: 750 mg via INTRAVENOUS
  Filled 2013-09-16: qty 150

## 2013-09-16 MED ORDER — TAMSULOSIN HCL 0.4 MG PO CAPS: 0.4000 mg | ORAL_CAPSULE | Freq: Every day | ORAL | Status: DC

## 2013-09-16 MED ORDER — ONDANSETRON HCL 4 MG/2ML IJ SOLN
4.0000 mg | Freq: Once | INTRAMUSCULAR | Status: AC
  Administered 2013-09-16: 4 mg via INTRAVENOUS
  Filled 2013-09-16: qty 2

## 2013-09-16 MED ORDER — MORPHINE SULFATE 2 MG/ML IJ SOLN
2.0000 mg | Freq: Once | INTRAMUSCULAR | Status: AC
  Administered 2013-09-16: 2 mg via INTRAVENOUS
  Filled 2013-09-16: qty 1

## 2013-09-16 MED ORDER — ONDANSETRON HCL 4 MG PO TABS: 4.0000 mg | ORAL_TABLET | Freq: Four times a day (QID) | ORAL | Status: DC | PRN

## 2013-09-16 MED ORDER — ONDANSETRON HCL 4 MG/2ML IJ SOLN: 4.0000 mg | Freq: Four times a day (QID) | INTRAMUSCULAR | Status: DC | PRN

## 2013-09-16 MED ORDER — LEVOTHYROXINE SODIUM 100 MCG PO TABS
100.0000 ug | ORAL_TABLET | Freq: Every day | ORAL | Status: DC
  Administered 2013-09-17 – 2013-09-18 (×2): 100 ug via ORAL
  Filled 2013-09-16 (×3): qty 1

## 2013-09-16 MED ORDER — ENOXAPARIN SODIUM 40 MG/0.4ML ~~LOC~~ SOLN
40.0000 mg | SUBCUTANEOUS | Status: DC
  Administered 2013-09-16 – 2013-09-17 (×2): 40 mg via SUBCUTANEOUS
  Filled 2013-09-16 (×3): qty 0.4

## 2013-09-16 MED ORDER — CIPROFLOXACIN IN D5W 400 MG/200ML IV SOLN
400.0000 mg | Freq: Two times a day (BID) | INTRAVENOUS | Status: DC
  Administered 2013-09-16 – 2013-09-18 (×4): 400 mg via INTRAVENOUS
  Filled 2013-09-16 (×4): qty 200

## 2013-09-16 MED ORDER — OXYCODONE HCL 5 MG PO TABS
5.0000 mg | ORAL_TABLET | ORAL | Status: DC | PRN
  Filled 2013-09-16: qty 1

## 2013-09-16 MED ORDER — SODIUM CHLORIDE 0.9 % IV SOLN
INTRAVENOUS | Status: DC
  Administered 2013-09-16: 08:00:00 via INTRAVENOUS

## 2013-09-16 NOTE — ED Notes (Signed)
Pt comes from home with c/o LLQ abd.pain since last night, reports hx of ovarian cysts. Also c/o nausea.

## 2013-09-16 NOTE — Progress Notes (Signed)
UR completed 

## 2013-09-16 NOTE — H&P (Signed)
[facility] History and Physical  ROREY HODGES IRS:[id number] DOB: 1950-02-06 DOA: 09/16/2013  Referring physician: ED physician PCP: Vena Austria, MD   Chief Complaint: abd pain   HPI:  Pt is 63 yo female who presented to [facility] ED with main concern of sudden onset of progressively worsening LLQ abd pain, constant and dull, 10/10 in severity, associated with cramps, poor oral intake, nausea and non bloody vomiting. She reports no specific alleviating or aggravating factors, no similar events in the past, no recent sick contacts or exposures. She denies chest pain or shortness of breath, no specific urinary concerns.   In ED, pt noted to be in mild distress due to pain, vitas signs stable, CT abd notable for acute diverticulitis and [facility] asked to admit for further evaluation.   Assessment and Plan: Active Problems: Acute abdominal pain - secondary to acute diverticulitis - will admit to medical floor - start with providing supportive care with IVF, analgesia and antiemetics as needed - will place on Ciprofloxacin and Flagyl IV for now - allow clear liquid diet if pt able to tolerate  Renal lesion, right kidney  - noted on CT abd/pelvis - will need an outpatient follow  - recommended pre and post contrast CT or MRI, non acute  Hypothyroidism  - continue synthroid    Radiological Exams on Admission Ct Abdomen Pelvis W Contrast  09/16/2013  Focal inflammatory change about descending colon most compatible with acute diverticulitis. If not previously performed, recommend correlation with colonoscopy after resolution of the acute symptomatology.  Indeterminate 1.1 cm renal lesion within superior pole of the right kidney. Recommend dedicated evaluation with pre and post-contrast CT or MRI in the non acute setting.  Interval decrease in size of left adnexal cyst. Consider follow-up pelvic ultrasound to evaluate for interval resolution.  Cholelithiasis.    Code Status:  Full Family Communication: Pt at bedside Disposition Plan: Admit for further evaluation    Review of Systems:  Constitutional: Negative for diaphoresis.  HENT: Negative for hearing loss, ear pain, nosebleeds, congestion, sore throat, neck pain, tinnitus and ear discharge.   Eyes: Negative for blurred vision, double vision, photophobia, pain, discharge and redness.  Respiratory: Negative for cough, hemoptysis, sputum production, shortness of breath, wheezing and stridor.   Cardiovascular: Negative for chest pain, palpitations, orthopnea, claudication and leg swelling.  Gastrointestinal: Negative for heartburn, constipation, blood in stool and melena.  Genitourinary: Negative for dysuria, urgency, frequency, hematuria and flank pain.  Musculoskeletal: Negative for myalgias, back pain, joint pain and falls.  Skin: Negative for itching and rash.  Neurological: Negative for dizziness and weakness.  Endo/Heme/Allergies: Negative for environmental allergies and polydipsia. Does not bruise/bleed easily.  Psychiatric/Behavioral: Negative for suicidal ideas. The patient is not nervous/anxious.      Past Medical History  Diagnosis Date  . Hyperlipidemia   . Thyroid disease   . Migraine headache   . Anemia   . Kidney stone     Past Surgical History  Procedure Laterality Date  . Knee surgery      Social History:  reports that she quit smoking about 35 years ago. Her smoking use included Cigarettes. She smoked 0.00 packs per day for 1 year. She has never used smokeless tobacco. She reports that she drinks alcohol. She reports that she does not use illicit drugs.  Allergies  Allergen Reactions  . Codeine Anxiety  . Maxalt [Rizatriptan Benzoate] Nausea And Vomiting  . Penicillins Nausea And Vomiting    No pertinent family medical  history   Prior to Admission medications   Medication Sig Start Date End Date Taking? Authorizing Provider  cyclobenzaprine (FLEXERIL) 10 MG tablet Take 5 mg  by mouth 3 (three) times daily as needed.     Yes Historical Provider, MD  levothyroxine (SYNTHROID, LEVOTHROID) 100 MCG tablet Take 100 mcg by mouth daily before breakfast.   Yes Historical Provider, MD  naproxen sodium (ANAPROX) 220 MG tablet Take 440 mg by mouth once as needed (pain.).   Yes Historical Provider, MD  Cholecalciferol (VITAMIN D PO) Take by mouth daily.      Historical Provider, MD  citalopram (CELEXA) 10 MG tablet Take by mouth daily.    Historical Provider, MD  Cranberry-Milk Thistle (LIVER & KIDNEY CLEANSER PO) Take by mouth daily.      Historical Provider, MD  KRILL OIL PO Take by mouth daily.      Historical Provider, MD  LORazepam (ATIVAN) 0.5 MG tablet Take 0.5 mg by mouth as needed.     Historical Provider, MD  metaxalone (SKELAXIN) 800 MG tablet Take 400 mg by mouth as needed.     Historical Provider, MD  Multiple Vitamin (MULTIVITAMIN) capsule Take 1 capsule by mouth daily.      Historical Provider, MD  ondansetron (ZOFRAN) 4 MG tablet Take 1 tablet (4 mg total) by mouth every 6 (six) hours as needed for nausea. 08/17/12   John-Adam Bonk, MD  Red Yeast Rice 600 MG CAPS Take by mouth.      Historical Provider, MD  rosuvastatin (CRESTOR) 10 MG tablet Take 10 mg by mouth daily.    Historical Provider, MD  tamsulosin (FLOMAX) 0.4 MG CAPS capsule Take 0.4 mg by mouth daily.    Historical Provider, MD    Physical Exam: Filed Vitals:   09/16/13 0718 09/16/13 0957  BP: 96/63 116/70  Pulse: 68 63  Temp: 98 F (36.7 C) 98.1 F (36.7 C)  TempSrc: Oral Oral  Resp: 18 18  SpO2: 96% 99%    Physical Exam  Constitutional: Appears well-developed and well-nourished. No distress.  HENT: Normocephalic. External right and left ear normal. Oropharynx is clear and moist.  Eyes: Conjunctivae and EOM are normal. PERRLA, no scleral icterus.  Neck: Normal ROM. Neck supple. No JVD. No tracheal deviation. No thyromegaly.  CVS: RRR, S1/S2 +, no murmurs, no gallops, no carotid bruit.   Pulmonary: Effort and breath sounds normal, no stridor, rhonchi, wheezes, rales.  Abdominal: Soft. BS +,  no distension, tenderness, rebound or guarding.  Musculoskeletal: Normal range of motion. No edema and no tenderness.  Lymphadenopathy: No lymphadenopathy noted, cervical, inguinal. Neuro: Alert. Normal reflexes, muscle tone coordination. No cranial nerve deficit. Skin: Skin is warm and dry. No rash noted. Not diaphoretic. No erythema. No pallor.  Psychiatric: Normal mood and affect. Behavior, judgment, thought content normal.   Labs on Admission:  Basic Metabolic Panel:  Recent Labs Lab 09/16/13 0809  NA 138  K 4.1  CL 101  CO2 24  GLUCOSE 108*  BUN 15  CREATININE 0.76  CALCIUM 9.3   Liver Function Tests:  Recent Labs Lab 09/16/13 0809  AST 22  ALT 22  ALKPHOS 90  BILITOT 0.5  PROT 7.6  ALBUMIN 3.8    Recent Labs Lab 09/16/13 0809  LIPASE 23   CBC:  Recent Labs Lab 09/16/13 0809  WBC 10.3  NEUTROABS 8.4*  HGB 14.1  HCT 41.1  MCV 90.3  PLT 230    EKG: Normal sinus rhythm, no ST/T wave  changes  Faye Ramsay, MD  [facility] Pager [phone]  If 7PM-7AM, please contact night-coverage [email or web address] Password [email or web address] 09/16/2013, 11:20 AM

## 2013-09-16 NOTE — ED Notes (Signed)
Called to give report. Told to call back in 5 minutes.

## 2013-09-16 NOTE — ED Provider Notes (Signed)
CSN: [id number]     Arrival date & time 09/16/13  0709 History   First MD Initiated Contact with Patient 09/16/13 [phone]     Chief Complaint  Patient presents with  . Abdominal Pain     (Consider location/radiation/quality/duration/timing/severity/associated sxs/prior Treatment) HPI This patient has a history of left lower quadrant pain that started yesterday evening. It was gradual in onset. By late yesterday evening it was quite intense. This morning she awoke and had severe pain that precipitated vomiting. At that point time she reports it hurts significantly to move her leg and had to lie flat on the floor. She reports it is improving at this point time in onset it was rated a 10 out of 10 at this point time she rates it a 4/10. There is no associated diarrhea no blood in the stool no bloody emesis. Patient has a prior history of a 9 cm ovarian cyst diagnosed one and a half years ago. She reports that point time she declined surgical treatment and had been trying empiric herbal treatments. She reports it had improved and apparently at some point and recheck measured approximately 4 cm. She pushed became concerned because she had been told that when the possible complications was a torsion. The patient had not been ill leading up to this event. 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  Past Medical History  Diagnosis Date  . Hyperlipidemia   . Thyroid disease   . Migraine headache   . Anemia   . Kidney stone    Past Surgical History  Procedure Laterality Date  . Knee surgery     No family history on file. History  Substance Use Topics  . Smoking status: Former Research scientist (life sciences)  . Smokeless tobacco: Not on file  . Alcohol Use: Yes   OB History   Grav Para Term Preterm Abortions TAB SAB Ect Mult Living                 Review of Systems  Constitutional: Negative for fever and chills.  HENT: Negative for congestion  and sore throat.   Respiratory: Negative for cough and shortness of breath.   Cardiovascular: Negative for chest pain and leg swelling.  Gastrointestinal: Positive for vomiting and abdominal pain. Negative for diarrhea, constipation, blood in stool, anal bleeding and rectal pain.  Genitourinary: Negative for dysuria and difficulty urinating.  Musculoskeletal: Negative for arthralgias and myalgias.  Skin: Negative for color change and rash.  Neurological: Negative for dizziness, weakness and headaches.  Psychiatric/Behavioral: Negative for confusion and agitation.      Allergies  Codeine; Maxalt; and Penicillins  Home Medications   Prior to Admission medications   Medication Sig Start Date End Date Taking? Authorizing Provider  Cholecalciferol (VITAMIN D PO) Take by mouth daily.      Historical Provider, MD  ciprofloxacin (CIPRO) 500 MG tablet Take 1 tablet (500 mg total) by mouth every 12 (twelve) hours. 08/17/12   John-Adam Bonk, MD  citalopram (CELEXA) 10 MG tablet Take by mouth daily.    Historical Provider, MD  Cranberry-Milk Thistle (LIVER & KIDNEY CLEANSER PO) Take by mouth daily.      Historical Provider, MD  cyclobenzaprine (FLEXERIL) 10 MG tablet Take 5 mg by mouth 3 (three) times daily as needed.      Historical Provider, MD  KRILL OIL PO Take by mouth daily.      Historical Provider, MD  LORazepam (ATIVAN) 0.5  MG tablet Take 0.5 mg by mouth as needed.     Historical Provider, MD  metaxalone (SKELAXIN) 800 MG tablet Take 400 mg by mouth as needed.     Historical Provider, MD  Multiple Vitamin (MULTIVITAMIN) capsule Take 1 capsule by mouth daily.      Historical Provider, MD  naproxen (NAPROSYN) 125 MG/5ML suspension Take by mouth as needed.     Historical Provider, MD  naproxen sodium (ALEVE) 220 MG tablet Take 2 tablets every 12 hours until stone passes or otherwise instructed. 09/20/11   John L Molpus, MD  ondansetron (ZOFRAN) 4 MG tablet Take 1 tablet (4 mg total) by mouth  every 6 (six) hours as needed for nausea. 08/17/12   John-Adam Bonk, MD  oxyCODONE-acetaminophen (PERCOCET/ROXICET) 5-325 MG per tablet Take 1-2 tablets by mouth every 6 (six) hours as needed for pain. 08/17/12   John-Adam Bonk, MD  Red Yeast Rice 600 MG CAPS Take by mouth.      Historical Provider, MD  rosuvastatin (CRESTOR) 10 MG tablet Take by mouth daily.    Historical Provider, MD  tamsulosin (FLOMAX) 0.4 MG CAPS capsule Take 1 capsule (0.4 mg total) by mouth daily. 08/17/12   John-Adam Bonk, MD  thyroid (ARMOUR) 90 MG tablet Take 90 mg by mouth daily.      Historical Provider, MD   BP 96/63  Pulse 68  Temp(Src) 98 F (36.7 C) (Oral)  Resp 18  SpO2 96% Physical Exam  Constitutional: She is oriented to person, place, and time. She appears well-developed and well-nourished.  The patient appears to be in moderate pain. Her pain is worsened by certain movements I can appreciate this when she stands and sits.  HENT:  Head: Normocephalic and atraumatic.  Eyes: EOM are normal. Pupils are equal, round, and reactive to light.  Neck: Neck supple.  Cardiovascular: Normal rate, regular rhythm, normal heart sounds and intact distal pulses.   Pulmonary/Chest: Effort normal and breath sounds normal.  Abdominal: Soft. Bowel sounds are normal. She exhibits no distension and no mass. There is tenderness. There is no rebound and no guarding.  The patient has moderate to severe pain in the left lower quadrant. She is not guarding.  Genitourinary: Guaiac negative stool.  Rectal examination is nontender there is a small amount of brown stool in the vault.  Musculoskeletal: Normal range of motion. She exhibits no edema.  Neurological: She is alert and oriented to person, place, and time. She has normal strength. Coordination normal. GCS eye subscore is 4. GCS verbal subscore is 5. GCS motor subscore is 6.  Skin: Skin is warm, dry and intact.  Psychiatric: She has a normal mood and affect.    ED Course   Procedures (including critical care time) Labs Review Labs Reviewed - No data to display  Imaging Review No results found.   EKG Interpretation None     Consult to Will Jenning. Recommends Medical admit with Surgical consult if needed. Consult was placed to [facility] hospitalist MDM   Final diagnoses:  Diverticulitis of large intestine without perforation or abscess without bleeding    This patient presents with left lower quadrant pain which at this point time is consistent with diverticulitis this the CT scan confirmed. Patient has been started on antibiotics in the emergency department. At this point in time she is nontoxic with stable vital signs.    Charlesetta Shanks, MD 09/16/13 1124

## 2013-09-17 DIAGNOSIS — E039 Hypothyroidism, unspecified: Secondary | ICD-10-CM

## 2013-09-17 DIAGNOSIS — K5732 Diverticulitis of large intestine without perforation or abscess without bleeding: Principal | ICD-10-CM

## 2013-09-17 LAB — BASIC METABOLIC PANEL
Anion gap: 13 (ref 5–15)
BUN: 8 mg/dL (ref 6–23)
CO2: 23 mEq/L (ref 19–32)
CREATININE: 0.69 mg/dL (ref 0.50–1.10)
Calcium: 8.8 mg/dL (ref 8.4–10.5)
Chloride: 102 mEq/L (ref 96–112)
GFR calc Af Amer: >90 mL/min (ref >90)
GFR calc non Af Amer: >90 mL/min (ref >90)
GLUCOSE: 105 mg/dL — AB (ref 70–99)
POTASSIUM: 3.7 meq/L (ref 3.7–5.3)
Sodium: 138 mEq/L (ref 137–147)

## 2013-09-17 LAB — CBC
HCT: 39.5 % (ref 36.0–46.0)
HEMOGLOBIN: 13 g/dL (ref 12.0–15.0)
MCH: 30.6 pg (ref 26.0–34.0)
MCHC: 32.9 g/dL (ref 30.0–36.0)
MCV: 92.9 fL (ref 78.0–100.0)
PLATELETS: 213 10*3/uL (ref 150–400)
RBC: 4.25 MIL/uL (ref 3.87–5.11)
RDW: 12.8 % (ref 11.5–15.5)
WBC: 6.3 10*3/uL (ref 4.0–10.5)

## 2013-09-17 NOTE — Progress Notes (Signed)
PROGRESS NOTE  Katherine Morton GUR:[id number] DOB: 1950/02/17 DOA: 09/16/2013 PCP: Vena Austria, MD  Interim history 63 year old female with a history of hypothyroidism, hyperlipidemia, and nephrolithiasis presented with an acute onset lower quadrant abdominal pain on the evening prior to admission. The pain progressively worsened through the evening. As a result, the patient came to the emergency department on 09/16/2013. The patient denied any diarrhea, hematochezia, fevers, chills, chest pain, shortness breath. CT of the abdomen and pelvis revealed diverticulitis of the descending colon. The patient was started on IV fluids and intravenous Cipro and Flagyl. This is the patient's first episode of diverticulitis. The patient had a colonoscopy 1-1/2 years ago which was normal according to her. Assessment/Plan: Acute diverticulitis  -tolerating clear liquid diet -Advanced to full liquids  -Continue IV Flagyl and ciprofloxacin  -Continue antibiotics and opioids  right renal indeterminate lesion -The patient has a history of bilateral renal cysts but has a new 1.1 cm low attenuating indeterminate lesion -I made a nonurgent MRI or pre and post contrast CT in outpt setting  hypothyroidism  -Continue Synthroid   Family Communication:   Son updated at beside Disposition Plan:   Home when medically stable   Antibiotics:  cipro 09/16/13>>>  Flagyl 09/16/13>>>    Procedures/Studies: Ct Abdomen Pelvis W Contrast  09/16/2013   CLINICAL DATA:  Left lower quadrant pain.  History of ovarian cyst.  EXAM: CT ABDOMEN AND PELVIS WITH CONTRAST  TECHNIQUE: Multidetector CT imaging of the abdomen and pelvis was performed using the standard protocol following bolus administration of intravenous contrast.  CONTRAST:  131mL OMNIPAQUE IOHEXOL 300 MG/ML  SOLN  COMPARISON:  CT 05/23/2013  FINDINGS: Visualization of the lower thorax demonstrates dependent atelectasis within the bilateral lower  lobes and lingula. No pleural effusion. Normal heart size.  Liver is normal in size and contour without focal hepatic lesion identified. Non calcified gallstone within the gallbladder lumen. No intrahepatic or extrahepatic biliary ductal dilatation. The spleen, pancreas and bilateral adrenal glands are unremarkable.  Bilateral low-attenuation renal lesions are re- demonstrated largest lesions within the bilateral kidneys are compatible with cyst. Additionally within the superior pole of the right kidney there is a 1.1 cm low-attenuation lesion (image 32; series 2) which demonstrates an internal density of 40 Hounsfield units.  Normal caliber abdominal aorta. No retroperitoneal lymphadenopathy. Urinary bladder is unremarkable. Uterus is unremarkable. Within the left adnexum there is a 5.8 cm cystic lesion, decreased in size from prior CT were it measured 6.7 cm. Small amount of free fluid in the pelvis.  Sigmoid colonic diverticulosis. There is acute inflammatory stranding and a small amount of fluid about the descending colon (image 78; series 2) compatible with acute diverticulitis. The appendix is normal. No bowel obstruction.  No aggressive or acute appearing osseous lesions.  IMPRESSION: Focal inflammatory change about descending colon most compatible with acute diverticulitis. If not previously performed, recommend correlation with colonoscopy after resolution of the acute symptomatology.  Indeterminate 1.1 cm renal lesion within superior pole of the right kidney. Recommend dedicated evaluation with pre and post-contrast CT or MRI in the non acute setting.  Interval decrease in size of left adnexal cyst. Consider follow-up pelvic ultrasound to evaluate for interval resolution.  Cholelithiasis.  These results were called by telephone at the time of interpretation on 09/16/2013 at 9:25 am to Dr. Charlesetta Shanks , who verbally acknowledged these results.   Electronically Signed   By: Polly Cobia.D.  On: 09/16/2013  09:26         Subjective: Patient still has left lower quadrant abdominal pain but it is somewhat better than the day of admission. She denies any nausea, vomiting, diarrhea, chest pain, shortness breath, coughing, hemoptysis, dysuria, hematuria   Objective: Filed Vitals:   09/17/13 0003 09/17/13 0200 09/17/13 0600 09/17/13 1356  BP:  114/64 112/75 109/76  Pulse:  66 63 61  Temp:  98.7 F (37.1 C) 97.3 F (36.3 C) 98.8 F (37.1 C)  TempSrc:  Oral Oral Oral  Resp:  16 16 16   Height: 5\' 5"  (1.651 m)     Weight: 68.04 kg (150 lb)     SpO2:  97% 98% 100%    Intake/Output Summary (Last 24 hours) at 09/17/13 1447 Last data filed at 09/17/13 1400  Gross per 24 hour  Intake 2806.25 ml  Output      0 ml  Net 2806.25 ml   Weight change:  Exam:   General:  Pt is alert, follows commands appropriately, not in acute distress  HEENT: No icterus, No thrush, No neck mass, [place]/AT  Cardiovascular: RRR, S1/S2, no rubs, no gallops  Respiratory: CTA bilaterally, no wheezing, no crackles, no rhonchi  Abdomen: Soft/+BS, LLQ tender without peritoneal sign, non distended, no guarding  Extremities: No edema, No lymphangitis, No petechiae, No rashes, no synovitis  Data Reviewed: Basic Metabolic Panel:  Recent Labs Lab 09/16/13 0809 09/17/13 0540  NA 138 138  K 4.1 3.7  CL 101 102  CO2 24 23  GLUCOSE 108* 105*  BUN 15 8  CREATININE 0.76 0.69  CALCIUM 9.3 8.8   Liver Function Tests:  Recent Labs Lab 09/16/13 0809  AST 22  ALT 22  ALKPHOS 90  BILITOT 0.5  PROT 7.6  ALBUMIN 3.8    Recent Labs Lab 09/16/13 0809  LIPASE 23   No results found for this basename: AMMONIA,  in the last 168 hours CBC:  Recent Labs Lab 09/16/13 0809 09/17/13 0540  WBC 10.3 6.3  NEUTROABS 8.4*  --   HGB 14.1 13.0  HCT 41.1 39.5  MCV 90.3 92.9  PLT 230 213   Cardiac Enzymes: No results found for this basename: CKTOTAL, CKMB, CKMBINDEX, TROPONINI,  in the last 168  hours BNP: No components found with this basename: POCBNP,  CBG: No results found for this basename: GLUCAP,  in the last 168 hours  No results found for this or any previous visit (from the past 240 hour(s)).   Scheduled Meds: . ciprofloxacin  400 mg Intravenous Q12H  . enoxaparin (LOVENOX) injection  40 mg Subcutaneous Q24H  . levothyroxine  100 mcg Oral QAC breakfast  . metronidazole  500 mg Intravenous Q8H   Continuous Infusions: . sodium chloride 75 mL/hr (09/17/13 0902)     Brexton Sofia, DO  [facility] Pager [phone]  If 7PM-7AM, please contact night-coverage [email or web address] Password [email or web address] 09/17/2013, 2:47 PM   LOS: 1 day

## 2013-09-18 LAB — BASIC METABOLIC PANEL
Anion gap: 11 (ref 5–15)
BUN: 8 mg/dL (ref 6–23)
CO2: 26 meq/L (ref 19–32)
CREATININE: 0.8 mg/dL (ref 0.50–1.10)
Calcium: 8.8 mg/dL (ref 8.4–10.5)
Chloride: 105 mEq/L (ref 96–112)
GFR calc Af Amer: 90 mL/min — ABNORMAL LOW (ref >90)
GFR calc non Af Amer: 77 mL/min — ABNORMAL LOW (ref >90)
Glucose, Bld: 95 mg/dL (ref 70–99)
Potassium: 4 mEq/L (ref 3.7–5.3)
Sodium: 142 mEq/L (ref 137–147)

## 2013-09-18 MED ORDER — METRONIDAZOLE 500 MG PO TABS: 500.0000 mg | ORAL_TABLET | Freq: Three times a day (TID) | ORAL | Status: DC

## 2013-09-18 MED ORDER — METRONIDAZOLE 500 MG PO TABS
500.0000 mg | ORAL_TABLET | Freq: Three times a day (TID) | ORAL | Status: DC
  Filled 2013-09-18 (×2): qty 1

## 2013-09-18 MED ORDER — CIPROFLOXACIN HCL 500 MG PO TABS: 500.0000 mg | ORAL_TABLET | Freq: Two times a day (BID) | ORAL | Status: DC

## 2013-09-18 MED ORDER — CIPROFLOXACIN HCL 500 MG PO TABS
500.0000 mg | ORAL_TABLET | Freq: Two times a day (BID) | ORAL | Status: DC
  Filled 2013-09-18 (×2): qty 1

## 2013-09-18 MED ORDER — TRAMADOL HCL 50 MG PO TABS: 50.0000 mg | ORAL_TABLET | Freq: Four times a day (QID) | ORAL | Status: DC | PRN

## 2013-09-18 NOTE — Discharge Summary (Signed)
Physician Discharge Summary  Katherine Morton:[id number] DOB: 20-Jul-1950 DOA: 09/16/2013  PCP: Vena Austria, MD  Admit date: 09/16/2013 Discharge date: 09/18/2013  Recommendations for Outpatient Follow-up:  1. Pt will need to follow up with PCP in 2 weeks post discharge    Discharge Diagnoses:  Acute diverticulitis  -tolerating clear liquid diet-->full liquid diet  -Advanced to soft diet which the patient tolerated -Continue  Flagyl and ciprofloxacin--9 more days which will complete 10 days of therapy  -Continue antibiotics  -tramadol 50mg , #10, on po q 6hrs prn pain, No refill -pt advised not to drink any alcohol during abx and for 1 week after she is finished with abx right renal indeterminate lesion  -The patient has a history of bilateral renal cysts but has a new 1.1 cm low attenuating indeterminate lesion  - nonurgent MRI or pre and post contrast CT in outpt setting  -pt is informed and is aware hypothyroidism  -Continue Synthroid  Family Communication: Son updated at beside  Disposition Plan: Home when medically stable  Antibiotics:  cipro 09/16/13>>>  Flagyl 09/16/13>>>   Discharge Condition: stable  Disposition: home  Diet:high fiber Wt Readings from Last 3 Encounters:  09/17/13 68.04 kg (150 lb)  09/05/10 66.497 kg (146 lb 9.6 oz)    History of present illness:  63 year old female with a history of hypothyroidism, hyperlipidemia, and nephrolithiasis presented with an acute onset lower quadrant abdominal pain on the evening prior to admission. The pain progressively worsened through the evening. As a result, the patient came to the emergency department on 09/16/2013. The patient denied any diarrhea, hematochezia, fevers, chills, chest pain, shortness breath. CT of the abdomen and pelvis revealed diverticulitis of the descending colon. The patient was started on IV fluids and intravenous Cipro and Flagyl. This is the patient's first episode of  diverticulitis. The patient had a colonoscopy 1-1/2 years ago which was normal according to her. The patient's diet was gradually advanced to a soft diet which the patient tolerated. She was continued on intravenous antibiotics. The patient's pain gradually improved. Her fluids were saline locked. The patient's labs remained stable. She remained hemodynamically stable and afebrile. She was discharged in stable condition to follow up with her primary care provider.    Discharge Exam: Filed Vitals:   09/18/13 0530  BP: 114/70  Pulse: 59  Temp: 98.4 F (36.9 C)  Resp: 14   Filed Vitals:   09/17/13 0600 09/17/13 1356 09/17/13 2152 09/18/13 0530  BP: 112/75 109/76 112/75 114/70  Pulse: 63 61 56 59  Temp: 97.3 F (36.3 C) 98.8 F (37.1 C) 98.5 F (36.9 C) 98.4 F (36.9 C)  TempSrc: Oral Oral Oral   Resp: 16 16 16 14   Height:      Weight:      SpO2: 98% 100% 99% 100%   General: A&O x 3, NAD, pleasant, cooperative Cardiovascular: RRR, no rub, no gallop, no S3 Respiratory: CTAB, no wheeze, no rhonchi Abdomen:soft, mild LLQ tender without rebound or guarding, nondistended, positive bowel sounds Extremities: No edema, No lymphangitis, no petechiae  Discharge Instructions      Discharge Instructions   Diet - low sodium heart healthy    Complete by:  As directed      Increase activity slowly    Complete by:  As directed             Medication List         ciprofloxacin 500 MG tablet  Commonly known as:  CIPRO  Take 1 tablet (500 mg total) by mouth 2 (two) times daily.     citalopram 10 MG tablet  Commonly known as:  CELEXA  Take by mouth daily.     cyclobenzaprine 10 MG tablet  Commonly known as:  FLEXERIL  Take 5 mg by mouth 3 (three) times daily as needed.     KRILL OIL PO  Take by mouth daily.     levothyroxine 100 MCG tablet  Commonly known as:  SYNTHROID, LEVOTHROID  Take 100 mcg by mouth daily before breakfast.     LIVER & KIDNEY CLEANSER PO  Take by  mouth daily.     LORazepam 0.5 MG tablet  Commonly known as:  ATIVAN  Take 0.5 mg by mouth as needed.     metaxalone 800 MG tablet  Commonly known as:  SKELAXIN  Take 400 mg by mouth as needed.     metroNIDAZOLE 500 MG tablet  Commonly known as:  FLAGYL  Take 1 tablet (500 mg total) by mouth every 8 (eight) hours.     multivitamin capsule  Take 1 capsule by mouth daily.     naproxen sodium 220 MG tablet  Commonly known as:  ANAPROX  Take 440 mg by mouth once as needed (pain.).     ondansetron 4 MG tablet  Commonly known as:  ZOFRAN  Take 1 tablet (4 mg total) by mouth every 6 (six) hours as needed for nausea.     Red Yeast Rice 600 MG Caps  Take by mouth.     rosuvastatin 10 MG tablet  Commonly known as:  CRESTOR  Take 10 mg by mouth daily.     tamsulosin 0.4 MG Caps capsule  Commonly known as:  FLOMAX  Take 0.4 mg by mouth daily.     traMADol 50 MG tablet  Commonly known as:  ULTRAM  Take 1 tablet (50 mg total) by mouth every 6 (six) hours as needed for moderate pain.     VITAMIN D PO  Take by mouth daily.         The results of significant diagnostics from this hospitalization (including imaging, microbiology, ancillary and laboratory) are listed below for reference.    Significant Diagnostic Studies: Ct Abdomen Pelvis W Contrast  09/16/2013   CLINICAL DATA:  Left lower quadrant pain.  History of ovarian cyst.  EXAM: CT ABDOMEN AND PELVIS WITH CONTRAST  TECHNIQUE: Multidetector CT imaging of the abdomen and pelvis was performed using the standard protocol following bolus administration of intravenous contrast.  CONTRAST:  111mL OMNIPAQUE IOHEXOL 300 MG/ML  SOLN  COMPARISON:  CT 05/23/2013  FINDINGS: Visualization of the lower thorax demonstrates dependent atelectasis within the bilateral lower lobes and lingula. No pleural effusion. Normal heart size.  Liver is normal in size and contour without focal hepatic lesion identified. Non calcified gallstone within the  gallbladder lumen. No intrahepatic or extrahepatic biliary ductal dilatation. The spleen, pancreas and bilateral adrenal glands are unremarkable.  Bilateral low-attenuation renal lesions are re- demonstrated largest lesions within the bilateral kidneys are compatible with cyst. Additionally within the superior pole of the right kidney there is a 1.1 cm low-attenuation lesion (image 32; series 2) which demonstrates an internal density of 40 Hounsfield units.  Normal caliber abdominal aorta. No retroperitoneal lymphadenopathy. Urinary bladder is unremarkable. Uterus is unremarkable. Within the left adnexum there is a 5.8 cm cystic lesion, decreased in size from prior CT were it measured 6.7 cm. Small amount of free fluid in the pelvis.  Sigmoid colonic diverticulosis. There is acute inflammatory stranding and a small amount of fluid about the descending colon (image 78; series 2) compatible with acute diverticulitis. The appendix is normal. No bowel obstruction.  No aggressive or acute appearing osseous lesions.  IMPRESSION: Focal inflammatory change about descending colon most compatible with acute diverticulitis. If not previously performed, recommend correlation with colonoscopy after resolution of the acute symptomatology.  Indeterminate 1.1 cm renal lesion within superior pole of the right kidney. Recommend dedicated evaluation with pre and post-contrast CT or MRI in the non acute setting.  Interval decrease in size of left adnexal cyst. Consider follow-up pelvic ultrasound to evaluate for interval resolution.  Cholelithiasis.  These results were called by telephone at the time of interpretation on 09/16/2013 at 9:25 am to Dr. Charlesetta Shanks , who verbally acknowledged these results.   Electronically Signed   By: Lovey Newcomer M.D.   On: 09/16/2013 09:26     Microbiology: No results found for this or any previous visit (from the past 240 hour(s)).   Labs: Basic Metabolic Panel:  Recent Labs Lab  09/16/13 0809 09/17/13 0540 09/18/13 0630  NA 138 138 142  K 4.1 3.7 4.0  CL 101 102 105  CO2 24 23 26   GLUCOSE 108* 105* 95  BUN 15 8 8   CREATININE 0.76 0.69 0.80  CALCIUM 9.3 8.8 8.8   Liver Function Tests:  Recent Labs Lab 09/16/13 0809  AST 22  ALT 22  ALKPHOS 90  BILITOT 0.5  PROT 7.6  ALBUMIN 3.8    Recent Labs Lab 09/16/13 0809  LIPASE 23   No results found for this basename: AMMONIA,  in the last 168 hours CBC:  Recent Labs Lab 09/16/13 0809 09/17/13 0540  WBC 10.3 6.3  NEUTROABS 8.4*  --   HGB 14.1 13.0  HCT 41.1 39.5  MCV 90.3 92.9  PLT 230 213   Cardiac Enzymes: No results found for this basename: CKTOTAL, CKMB, CKMBINDEX, TROPONINI,  in the last 168 hours BNP: No components found with this basename: POCBNP,  CBG: No results found for this basename: GLUCAP,  in the last 168 hours  Time coordinating discharge:  Greater than 30 minutes  Signed:  Blanca Thornton, DO [facility] Pager: [phone] 09/18/2013, 1:30 PM

## 2013-09-19 NOTE — Progress Notes (Signed)
Discharge summary sent to payer through [facility]  

## 2015-03-22 ENCOUNTER — Other Ambulatory Visit: Payer: Self-pay | Admitting: Family Medicine

## 2015-03-22 DIAGNOSIS — R1032 Left lower quadrant pain: Secondary | ICD-10-CM

## 2015-04-02 ENCOUNTER — Ambulatory Visit
Admission: RE | Admit: 2015-04-02 | Discharge: 2015-04-02 | Disposition: A | Discharge status: Home / Self Care | Payer: BLUE CROSS/BLUE SHIELD | Source: Ambulatory Visit | Attending: Family Medicine | Admitting: Family Medicine

## 2015-04-02 DIAGNOSIS — R1032 Left lower quadrant pain: Secondary | ICD-10-CM

## 2015-04-10 ENCOUNTER — Other Ambulatory Visit (HOSPITAL_COMMUNITY)
Admission: RE | Admit: 2015-04-10 | Discharge: 2015-04-10 | Disposition: A | Discharge status: Home / Self Care | Payer: BLUE CROSS/BLUE SHIELD | Source: Ambulatory Visit | Attending: Nurse Practitioner | Admitting: Nurse Practitioner

## 2015-04-10 ENCOUNTER — Other Ambulatory Visit: Payer: Self-pay | Admitting: Nurse Practitioner

## 2015-04-10 DIAGNOSIS — Z1151 Encounter for screening for human papillomavirus (HPV): Secondary | ICD-10-CM | POA: Insufficient documentation

## 2015-04-10 DIAGNOSIS — Z01419 Encounter for gynecological examination (general) (routine) without abnormal findings: Secondary | ICD-10-CM | POA: Insufficient documentation

## 2015-04-11 LAB — CYTOLOGY - PAP

## 2015-04-30 ENCOUNTER — Other Ambulatory Visit: Payer: Self-pay

## 2015-04-30 DIAGNOSIS — Z1231 Encounter for screening mammogram for malignant neoplasm of breast: Secondary | ICD-10-CM

## 2015-05-01 ENCOUNTER — Ambulatory Visit
Admission: RE | Admit: 2015-05-01 | Discharge: 2015-05-01 | Disposition: A | Discharge status: Home / Self Care | Payer: BLUE CROSS/BLUE SHIELD | Source: Ambulatory Visit | Attending: Family Medicine | Admitting: Family Medicine

## 2015-05-01 ENCOUNTER — Other Ambulatory Visit: Payer: Self-pay | Admitting: Family Medicine

## 2015-05-01 DIAGNOSIS — R1084 Generalized abdominal pain: Secondary | ICD-10-CM

## 2015-05-21 ENCOUNTER — Ambulatory Visit
Admission: RE | Admit: 2015-05-21 | Discharge: 2015-05-21 | Disposition: A | Discharge status: Home / Self Care | Payer: BLUE CROSS/BLUE SHIELD | Source: Ambulatory Visit

## 2015-05-21 DIAGNOSIS — Z1231 Encounter for screening mammogram for malignant neoplasm of breast: Secondary | ICD-10-CM

## 2015-12-26 ENCOUNTER — Other Ambulatory Visit: Payer: Self-pay | Admitting: Family Medicine

## 2015-12-26 DIAGNOSIS — R109 Unspecified abdominal pain: Secondary | ICD-10-CM

## 2015-12-28 ENCOUNTER — Ambulatory Visit
Admission: RE | Admit: 2015-12-28 | Discharge: 2015-12-28 | Disposition: A | Discharge status: Home / Self Care | Payer: BLUE CROSS/BLUE SHIELD | Source: Ambulatory Visit | Attending: Family Medicine | Admitting: Family Medicine

## 2015-12-28 DIAGNOSIS — R109 Unspecified abdominal pain: Secondary | ICD-10-CM

## 2015-12-28 MED ORDER — IOPAMIDOL (ISOVUE-300) INJECTION 61%
100.0000 mL | Freq: Once | INTRAVENOUS | Status: AC | PRN
  Administered 2015-12-28: 100 mL via INTRAVENOUS

## 2016-04-07 DIAGNOSIS — R3121 Asymptomatic microscopic hematuria: Secondary | ICD-10-CM | POA: Diagnosis not present

## 2016-04-07 DIAGNOSIS — N200 Calculus of kidney: Secondary | ICD-10-CM | POA: Diagnosis not present

## 2016-04-07 DIAGNOSIS — N281 Cyst of kidney, acquired: Secondary | ICD-10-CM | POA: Diagnosis not present

## 2016-06-24 DIAGNOSIS — R5383 Other fatigue: Secondary | ICD-10-CM | POA: Diagnosis not present

## 2016-06-24 DIAGNOSIS — E039 Hypothyroidism, unspecified: Secondary | ICD-10-CM | POA: Diagnosis not present

## 2016-06-24 DIAGNOSIS — E782 Mixed hyperlipidemia: Secondary | ICD-10-CM | POA: Diagnosis not present

## 2016-06-24 DIAGNOSIS — F419 Anxiety disorder, unspecified: Secondary | ICD-10-CM | POA: Diagnosis not present

## 2016-06-24 DIAGNOSIS — E559 Vitamin D deficiency, unspecified: Secondary | ICD-10-CM | POA: Diagnosis not present

## 2016-06-24 DIAGNOSIS — F322 Major depressive disorder, single episode, severe without psychotic features: Secondary | ICD-10-CM | POA: Diagnosis not present

## 2016-08-05 DIAGNOSIS — F324 Major depressive disorder, single episode, in partial remission: Secondary | ICD-10-CM | POA: Diagnosis not present

## 2016-08-05 DIAGNOSIS — E559 Vitamin D deficiency, unspecified: Secondary | ICD-10-CM | POA: Diagnosis not present

## 2016-08-05 DIAGNOSIS — E782 Mixed hyperlipidemia: Secondary | ICD-10-CM | POA: Diagnosis not present

## 2016-08-05 DIAGNOSIS — E039 Hypothyroidism, unspecified: Secondary | ICD-10-CM | POA: Diagnosis not present

## 2016-08-18 DIAGNOSIS — H43812 Vitreous degeneration, left eye: Secondary | ICD-10-CM | POA: Diagnosis not present

## 2016-08-18 DIAGNOSIS — H2513 Age-related nuclear cataract, bilateral: Secondary | ICD-10-CM | POA: Diagnosis not present

## 2016-08-18 DIAGNOSIS — H43393 Other vitreous opacities, bilateral: Secondary | ICD-10-CM | POA: Diagnosis not present

## 2016-08-19 DIAGNOSIS — L570 Actinic keratosis: Secondary | ICD-10-CM | POA: Diagnosis not present

## 2016-08-19 DIAGNOSIS — D1801 Hemangioma of skin and subcutaneous tissue: Secondary | ICD-10-CM | POA: Diagnosis not present

## 2016-08-19 DIAGNOSIS — L821 Other seborrheic keratosis: Secondary | ICD-10-CM | POA: Diagnosis not present

## 2016-08-19 DIAGNOSIS — L814 Other melanin hyperpigmentation: Secondary | ICD-10-CM | POA: Diagnosis not present

## 2016-09-01 DIAGNOSIS — E039 Hypothyroidism, unspecified: Secondary | ICD-10-CM | POA: Diagnosis not present

## 2016-09-01 DIAGNOSIS — E7800 Pure hypercholesterolemia, unspecified: Secondary | ICD-10-CM | POA: Diagnosis not present

## 2016-09-01 DIAGNOSIS — E559 Vitamin D deficiency, unspecified: Secondary | ICD-10-CM | POA: Diagnosis not present

## 2016-09-18 DIAGNOSIS — L570 Actinic keratosis: Secondary | ICD-10-CM | POA: Diagnosis not present

## 2016-09-18 DIAGNOSIS — L821 Other seborrheic keratosis: Secondary | ICD-10-CM | POA: Diagnosis not present

## 2016-09-18 DIAGNOSIS — H0019 Chalazion unspecified eye, unspecified eyelid: Secondary | ICD-10-CM | POA: Diagnosis not present

## 2016-09-18 DIAGNOSIS — L820 Inflamed seborrheic keratosis: Secondary | ICD-10-CM | POA: Diagnosis not present

## 2016-10-01 DIAGNOSIS — H11002 Unspecified pterygium of left eye: Secondary | ICD-10-CM | POA: Diagnosis not present

## 2016-10-01 DIAGNOSIS — H10413 Chronic giant papillary conjunctivitis, bilateral: Secondary | ICD-10-CM | POA: Diagnosis not present

## 2016-10-01 DIAGNOSIS — D481 Neoplasm of uncertain behavior of connective and other soft tissue: Secondary | ICD-10-CM | POA: Diagnosis not present

## 2016-10-01 DIAGNOSIS — H43813 Vitreous degeneration, bilateral: Secondary | ICD-10-CM | POA: Diagnosis not present

## 2016-10-01 DIAGNOSIS — H2513 Age-related nuclear cataract, bilateral: Secondary | ICD-10-CM | POA: Diagnosis not present

## 2016-10-01 DIAGNOSIS — H04123 Dry eye syndrome of bilateral lacrimal glands: Secondary | ICD-10-CM | POA: Diagnosis not present

## 2017-01-07 DIAGNOSIS — L309 Dermatitis, unspecified: Secondary | ICD-10-CM | POA: Diagnosis not present

## 2017-01-07 DIAGNOSIS — E039 Hypothyroidism, unspecified: Secondary | ICD-10-CM | POA: Diagnosis not present

## 2017-01-22 DIAGNOSIS — E039 Hypothyroidism, unspecified: Secondary | ICD-10-CM | POA: Diagnosis not present

## 2017-02-10 DIAGNOSIS — B354 Tinea corporis: Secondary | ICD-10-CM | POA: Diagnosis not present

## 2017-03-20 DIAGNOSIS — E039 Hypothyroidism, unspecified: Secondary | ICD-10-CM | POA: Diagnosis not present

## 2017-03-20 DIAGNOSIS — R197 Diarrhea, unspecified: Secondary | ICD-10-CM | POA: Diagnosis not present

## 2017-03-20 DIAGNOSIS — R140 Abdominal distension (gaseous): Secondary | ICD-10-CM | POA: Diagnosis not present

## 2017-03-20 DIAGNOSIS — K580 Irritable bowel syndrome with diarrhea: Secondary | ICD-10-CM | POA: Diagnosis not present

## 2017-04-11 DIAGNOSIS — F411 Generalized anxiety disorder: Secondary | ICD-10-CM | POA: Diagnosis not present

## 2017-04-11 DIAGNOSIS — R0789 Other chest pain: Secondary | ICD-10-CM | POA: Diagnosis not present

## 2017-04-29 DIAGNOSIS — E039 Hypothyroidism, unspecified: Secondary | ICD-10-CM | POA: Diagnosis not present

## 2017-06-15 DIAGNOSIS — E559 Vitamin D deficiency, unspecified: Secondary | ICD-10-CM | POA: Diagnosis not present

## 2017-06-15 DIAGNOSIS — F324 Major depressive disorder, single episode, in partial remission: Secondary | ICD-10-CM | POA: Diagnosis not present

## 2017-06-15 DIAGNOSIS — E782 Mixed hyperlipidemia: Secondary | ICD-10-CM | POA: Diagnosis not present

## 2017-06-15 DIAGNOSIS — Z6826 Body mass index (BMI) 26.0-26.9, adult: Secondary | ICD-10-CM | POA: Diagnosis not present

## 2017-06-15 DIAGNOSIS — F419 Anxiety disorder, unspecified: Secondary | ICD-10-CM | POA: Diagnosis not present

## 2017-06-15 DIAGNOSIS — E039 Hypothyroidism, unspecified: Secondary | ICD-10-CM | POA: Diagnosis not present

## 2017-06-15 DIAGNOSIS — J9801 Acute bronchospasm: Secondary | ICD-10-CM | POA: Diagnosis not present

## 2017-07-29 DIAGNOSIS — E039 Hypothyroidism, unspecified: Secondary | ICD-10-CM | POA: Diagnosis not present

## 2017-08-18 DIAGNOSIS — D485 Neoplasm of uncertain behavior of skin: Secondary | ICD-10-CM | POA: Diagnosis not present

## 2017-08-18 DIAGNOSIS — L821 Other seborrheic keratosis: Secondary | ICD-10-CM | POA: Diagnosis not present

## 2017-08-18 DIAGNOSIS — L718 Other rosacea: Secondary | ICD-10-CM | POA: Diagnosis not present

## 2017-08-18 DIAGNOSIS — L988 Other specified disorders of the skin and subcutaneous tissue: Secondary | ICD-10-CM | POA: Diagnosis not present

## 2017-08-18 DIAGNOSIS — D2239 Melanocytic nevi of other parts of face: Secondary | ICD-10-CM | POA: Diagnosis not present

## 2017-09-08 DIAGNOSIS — E039 Hypothyroidism, unspecified: Secondary | ICD-10-CM | POA: Diagnosis not present

## 2017-09-14 DIAGNOSIS — J45909 Unspecified asthma, uncomplicated: Secondary | ICD-10-CM | POA: Diagnosis not present

## 2017-09-14 DIAGNOSIS — Z91018 Allergy to other foods: Secondary | ICD-10-CM | POA: Diagnosis not present

## 2017-11-13 DIAGNOSIS — E039 Hypothyroidism, unspecified: Secondary | ICD-10-CM | POA: Diagnosis not present

## 2017-11-22 DIAGNOSIS — L259 Unspecified contact dermatitis, unspecified cause: Secondary | ICD-10-CM | POA: Diagnosis not present

## 2017-12-23 DIAGNOSIS — E559 Vitamin D deficiency, unspecified: Secondary | ICD-10-CM | POA: Diagnosis not present

## 2017-12-23 DIAGNOSIS — Z0000 Encounter for general adult medical examination without abnormal findings: Secondary | ICD-10-CM | POA: Diagnosis not present

## 2017-12-23 DIAGNOSIS — E782 Mixed hyperlipidemia: Secondary | ICD-10-CM | POA: Diagnosis not present

## 2017-12-23 DIAGNOSIS — F419 Anxiety disorder, unspecified: Secondary | ICD-10-CM | POA: Diagnosis not present

## 2017-12-23 DIAGNOSIS — E039 Hypothyroidism, unspecified: Secondary | ICD-10-CM | POA: Diagnosis not present

## 2017-12-23 DIAGNOSIS — J9801 Acute bronchospasm: Secondary | ICD-10-CM | POA: Diagnosis not present

## 2017-12-23 DIAGNOSIS — J45909 Unspecified asthma, uncomplicated: Secondary | ICD-10-CM | POA: Diagnosis not present

## 2017-12-23 DIAGNOSIS — Z6825 Body mass index (BMI) 25.0-25.9, adult: Secondary | ICD-10-CM | POA: Diagnosis not present

## 2017-12-23 DIAGNOSIS — F324 Major depressive disorder, single episode, in partial remission: Secondary | ICD-10-CM | POA: Diagnosis not present

## 2018-01-05 DIAGNOSIS — Z8739 Personal history of other diseases of the musculoskeletal system and connective tissue: Secondary | ICD-10-CM | POA: Diagnosis not present

## 2018-01-05 DIAGNOSIS — N83202 Unspecified ovarian cyst, left side: Secondary | ICD-10-CM | POA: Diagnosis not present

## 2018-01-05 DIAGNOSIS — Z1231 Encounter for screening mammogram for malignant neoplasm of breast: Secondary | ICD-10-CM | POA: Diagnosis not present

## 2018-01-08 ENCOUNTER — Other Ambulatory Visit: Payer: Self-pay | Admitting: Nurse Practitioner

## 2018-01-08 DIAGNOSIS — M8580 Other specified disorders of bone density and structure, unspecified site: Secondary | ICD-10-CM

## 2018-01-08 DIAGNOSIS — Z1231 Encounter for screening mammogram for malignant neoplasm of breast: Secondary | ICD-10-CM

## 2018-02-25 DIAGNOSIS — Z800 Family history of malignant neoplasm of digestive organs: Secondary | ICD-10-CM | POA: Diagnosis not present

## 2018-02-25 DIAGNOSIS — R05 Cough: Secondary | ICD-10-CM | POA: Diagnosis not present

## 2018-03-03 ENCOUNTER — Ambulatory Visit
Admission: RE | Admit: 2018-03-03 | Discharge: 2018-03-03 | Disposition: A | Discharge status: Home / Self Care | Payer: BLUE CROSS/BLUE SHIELD | Source: Ambulatory Visit | Attending: Nurse Practitioner | Admitting: Nurse Practitioner

## 2018-03-03 DIAGNOSIS — Z1231 Encounter for screening mammogram for malignant neoplasm of breast: Secondary | ICD-10-CM | POA: Diagnosis not present

## 2018-06-17 DIAGNOSIS — R399 Unspecified symptoms and signs involving the genitourinary system: Secondary | ICD-10-CM | POA: Diagnosis not present

## 2018-07-07 DIAGNOSIS — E785 Hyperlipidemia, unspecified: Secondary | ICD-10-CM | POA: Diagnosis not present

## 2018-07-07 DIAGNOSIS — J45909 Unspecified asthma, uncomplicated: Secondary | ICD-10-CM | POA: Diagnosis not present

## 2018-07-07 DIAGNOSIS — E782 Mixed hyperlipidemia: Secondary | ICD-10-CM | POA: Diagnosis not present

## 2018-07-07 DIAGNOSIS — F324 Major depressive disorder, single episode, in partial remission: Secondary | ICD-10-CM | POA: Diagnosis not present

## 2018-07-07 DIAGNOSIS — E039 Hypothyroidism, unspecified: Secondary | ICD-10-CM | POA: Diagnosis not present

## 2018-07-07 DIAGNOSIS — M8580 Other specified disorders of bone density and structure, unspecified site: Secondary | ICD-10-CM | POA: Diagnosis not present

## 2018-07-07 DIAGNOSIS — F322 Major depressive disorder, single episode, severe without psychotic features: Secondary | ICD-10-CM | POA: Diagnosis not present

## 2018-07-07 DIAGNOSIS — E7800 Pure hypercholesterolemia, unspecified: Secondary | ICD-10-CM | POA: Diagnosis not present

## 2018-07-07 DIAGNOSIS — J4520 Mild intermittent asthma, uncomplicated: Secondary | ICD-10-CM | POA: Diagnosis not present

## 2018-07-15 DIAGNOSIS — S30861A Insect bite (nonvenomous) of abdominal wall, initial encounter: Secondary | ICD-10-CM | POA: Diagnosis not present

## 2018-07-29 DIAGNOSIS — J4520 Mild intermittent asthma, uncomplicated: Secondary | ICD-10-CM | POA: Diagnosis not present

## 2018-07-29 DIAGNOSIS — M8580 Other specified disorders of bone density and structure, unspecified site: Secondary | ICD-10-CM | POA: Diagnosis not present

## 2018-07-29 DIAGNOSIS — J45909 Unspecified asthma, uncomplicated: Secondary | ICD-10-CM | POA: Diagnosis not present

## 2018-07-29 DIAGNOSIS — E039 Hypothyroidism, unspecified: Secondary | ICD-10-CM | POA: Diagnosis not present

## 2018-07-29 DIAGNOSIS — F322 Major depressive disorder, single episode, severe without psychotic features: Secondary | ICD-10-CM | POA: Diagnosis not present

## 2018-07-29 DIAGNOSIS — E782 Mixed hyperlipidemia: Secondary | ICD-10-CM | POA: Diagnosis not present

## 2018-07-30 DIAGNOSIS — K5730 Diverticulosis of large intestine without perforation or abscess without bleeding: Secondary | ICD-10-CM | POA: Diagnosis not present

## 2018-07-30 DIAGNOSIS — Z86010 Personal history of colonic polyps: Secondary | ICD-10-CM | POA: Diagnosis not present

## 2018-08-27 DIAGNOSIS — J45909 Unspecified asthma, uncomplicated: Secondary | ICD-10-CM | POA: Diagnosis not present

## 2018-08-27 DIAGNOSIS — E7800 Pure hypercholesterolemia, unspecified: Secondary | ICD-10-CM | POA: Diagnosis not present

## 2018-08-27 DIAGNOSIS — E782 Mixed hyperlipidemia: Secondary | ICD-10-CM | POA: Diagnosis not present

## 2018-08-27 DIAGNOSIS — F322 Major depressive disorder, single episode, severe without psychotic features: Secondary | ICD-10-CM | POA: Diagnosis not present

## 2018-08-27 DIAGNOSIS — E039 Hypothyroidism, unspecified: Secondary | ICD-10-CM | POA: Diagnosis not present

## 2018-08-27 DIAGNOSIS — M8580 Other specified disorders of bone density and structure, unspecified site: Secondary | ICD-10-CM | POA: Diagnosis not present

## 2018-08-27 DIAGNOSIS — E785 Hyperlipidemia, unspecified: Secondary | ICD-10-CM | POA: Diagnosis not present

## 2018-08-27 DIAGNOSIS — F324 Major depressive disorder, single episode, in partial remission: Secondary | ICD-10-CM | POA: Diagnosis not present

## 2018-08-27 DIAGNOSIS — J4520 Mild intermittent asthma, uncomplicated: Secondary | ICD-10-CM | POA: Diagnosis not present

## 2018-10-07 DIAGNOSIS — E039 Hypothyroidism, unspecified: Secondary | ICD-10-CM | POA: Diagnosis not present

## 2018-10-07 DIAGNOSIS — E785 Hyperlipidemia, unspecified: Secondary | ICD-10-CM | POA: Diagnosis not present

## 2018-10-07 DIAGNOSIS — M8580 Other specified disorders of bone density and structure, unspecified site: Secondary | ICD-10-CM | POA: Diagnosis not present

## 2018-10-07 DIAGNOSIS — J45909 Unspecified asthma, uncomplicated: Secondary | ICD-10-CM | POA: Diagnosis not present

## 2018-10-07 DIAGNOSIS — F322 Major depressive disorder, single episode, severe without psychotic features: Secondary | ICD-10-CM | POA: Diagnosis not present

## 2018-10-07 DIAGNOSIS — E782 Mixed hyperlipidemia: Secondary | ICD-10-CM | POA: Diagnosis not present

## 2018-10-07 DIAGNOSIS — F324 Major depressive disorder, single episode, in partial remission: Secondary | ICD-10-CM | POA: Diagnosis not present

## 2018-10-07 DIAGNOSIS — J4520 Mild intermittent asthma, uncomplicated: Secondary | ICD-10-CM | POA: Diagnosis not present

## 2018-10-07 DIAGNOSIS — E7800 Pure hypercholesterolemia, unspecified: Secondary | ICD-10-CM | POA: Diagnosis not present

## 2018-10-08 ENCOUNTER — Other Ambulatory Visit: Payer: Self-pay | Admitting: Family Medicine

## 2018-10-08 ENCOUNTER — Other Ambulatory Visit (HOSPITAL_COMMUNITY)
Admission: RE | Admit: 2018-10-08 | Discharge: 2018-10-08 | Disposition: A | Discharge status: Home / Self Care | Payer: PPO | Source: Ambulatory Visit | Attending: Family Medicine | Admitting: Family Medicine

## 2018-10-08 DIAGNOSIS — Z0000 Encounter for general adult medical examination without abnormal findings: Secondary | ICD-10-CM | POA: Insufficient documentation

## 2018-10-08 DIAGNOSIS — Z1151 Encounter for screening for human papillomavirus (HPV): Secondary | ICD-10-CM | POA: Diagnosis not present

## 2018-10-08 DIAGNOSIS — Z124 Encounter for screening for malignant neoplasm of cervix: Secondary | ICD-10-CM | POA: Diagnosis not present

## 2018-10-08 DIAGNOSIS — N950 Postmenopausal bleeding: Secondary | ICD-10-CM | POA: Diagnosis not present

## 2018-10-08 DIAGNOSIS — R399 Unspecified symptoms and signs involving the genitourinary system: Secondary | ICD-10-CM | POA: Diagnosis not present

## 2018-10-12 DIAGNOSIS — N950 Postmenopausal bleeding: Secondary | ICD-10-CM | POA: Diagnosis not present

## 2018-10-12 LAB — CYTOLOGY - PAP
Diagnosis: NEGATIVE
High risk HPV: NEGATIVE
Molecular Disclaimer: 56
Molecular Disclaimer: NORMAL

## 2018-10-13 ENCOUNTER — Other Ambulatory Visit: Payer: Self-pay | Admitting: Family Medicine

## 2018-10-13 DIAGNOSIS — N950 Postmenopausal bleeding: Secondary | ICD-10-CM

## 2018-10-27 DIAGNOSIS — N950 Postmenopausal bleeding: Secondary | ICD-10-CM | POA: Diagnosis not present

## 2018-10-27 DIAGNOSIS — E785 Hyperlipidemia, unspecified: Secondary | ICD-10-CM | POA: Diagnosis not present

## 2018-10-27 DIAGNOSIS — E782 Mixed hyperlipidemia: Secondary | ICD-10-CM | POA: Diagnosis not present

## 2018-10-27 DIAGNOSIS — F324 Major depressive disorder, single episode, in partial remission: Secondary | ICD-10-CM | POA: Diagnosis not present

## 2018-10-27 DIAGNOSIS — J4520 Mild intermittent asthma, uncomplicated: Secondary | ICD-10-CM | POA: Diagnosis not present

## 2018-10-27 DIAGNOSIS — E039 Hypothyroidism, unspecified: Secondary | ICD-10-CM | POA: Diagnosis not present

## 2018-10-27 DIAGNOSIS — M8580 Other specified disorders of bone density and structure, unspecified site: Secondary | ICD-10-CM | POA: Diagnosis not present

## 2018-10-27 DIAGNOSIS — F322 Major depressive disorder, single episode, severe without psychotic features: Secondary | ICD-10-CM | POA: Diagnosis not present

## 2018-10-27 DIAGNOSIS — R319 Hematuria, unspecified: Secondary | ICD-10-CM | POA: Diagnosis not present

## 2018-10-27 DIAGNOSIS — R399 Unspecified symptoms and signs involving the genitourinary system: Secondary | ICD-10-CM | POA: Diagnosis not present

## 2018-10-27 DIAGNOSIS — N281 Cyst of kidney, acquired: Secondary | ICD-10-CM | POA: Diagnosis not present

## 2018-10-27 DIAGNOSIS — J45909 Unspecified asthma, uncomplicated: Secondary | ICD-10-CM | POA: Diagnosis not present

## 2018-10-27 DIAGNOSIS — E7800 Pure hypercholesterolemia, unspecified: Secondary | ICD-10-CM | POA: Diagnosis not present

## 2018-11-30 DIAGNOSIS — E039 Hypothyroidism, unspecified: Secondary | ICD-10-CM | POA: Diagnosis not present

## 2018-11-30 DIAGNOSIS — E7800 Pure hypercholesterolemia, unspecified: Secondary | ICD-10-CM | POA: Diagnosis not present

## 2018-11-30 DIAGNOSIS — E782 Mixed hyperlipidemia: Secondary | ICD-10-CM | POA: Diagnosis not present

## 2019-01-13 DIAGNOSIS — J4520 Mild intermittent asthma, uncomplicated: Secondary | ICD-10-CM | POA: Diagnosis not present

## 2019-01-13 DIAGNOSIS — E785 Hyperlipidemia, unspecified: Secondary | ICD-10-CM | POA: Diagnosis not present

## 2019-01-13 DIAGNOSIS — F324 Major depressive disorder, single episode, in partial remission: Secondary | ICD-10-CM | POA: Diagnosis not present

## 2019-01-13 DIAGNOSIS — M8580 Other specified disorders of bone density and structure, unspecified site: Secondary | ICD-10-CM | POA: Diagnosis not present

## 2019-01-13 DIAGNOSIS — E7800 Pure hypercholesterolemia, unspecified: Secondary | ICD-10-CM | POA: Diagnosis not present

## 2019-01-13 DIAGNOSIS — F322 Major depressive disorder, single episode, severe without psychotic features: Secondary | ICD-10-CM | POA: Diagnosis not present

## 2019-01-13 DIAGNOSIS — E039 Hypothyroidism, unspecified: Secondary | ICD-10-CM | POA: Diagnosis not present

## 2019-01-13 DIAGNOSIS — E782 Mixed hyperlipidemia: Secondary | ICD-10-CM | POA: Diagnosis not present

## 2019-01-13 DIAGNOSIS — J45909 Unspecified asthma, uncomplicated: Secondary | ICD-10-CM | POA: Diagnosis not present

## 2019-01-19 DIAGNOSIS — E039 Hypothyroidism, unspecified: Secondary | ICD-10-CM | POA: Diagnosis not present

## 2019-01-19 DIAGNOSIS — E7800 Pure hypercholesterolemia, unspecified: Secondary | ICD-10-CM | POA: Diagnosis not present

## 2019-01-19 DIAGNOSIS — R109 Unspecified abdominal pain: Secondary | ICD-10-CM | POA: Diagnosis not present

## 2019-01-21 ENCOUNTER — Other Ambulatory Visit: Payer: Self-pay | Admitting: Nurse Practitioner

## 2019-01-21 DIAGNOSIS — R109 Unspecified abdominal pain: Secondary | ICD-10-CM

## 2019-01-31 DIAGNOSIS — M8580 Other specified disorders of bone density and structure, unspecified site: Secondary | ICD-10-CM | POA: Diagnosis not present

## 2019-01-31 DIAGNOSIS — F324 Major depressive disorder, single episode, in partial remission: Secondary | ICD-10-CM | POA: Diagnosis not present

## 2019-01-31 DIAGNOSIS — E782 Mixed hyperlipidemia: Secondary | ICD-10-CM | POA: Diagnosis not present

## 2019-01-31 DIAGNOSIS — E785 Hyperlipidemia, unspecified: Secondary | ICD-10-CM | POA: Diagnosis not present

## 2019-01-31 DIAGNOSIS — F322 Major depressive disorder, single episode, severe without psychotic features: Secondary | ICD-10-CM | POA: Diagnosis not present

## 2019-01-31 DIAGNOSIS — J45909 Unspecified asthma, uncomplicated: Secondary | ICD-10-CM | POA: Diagnosis not present

## 2019-01-31 DIAGNOSIS — E7800 Pure hypercholesterolemia, unspecified: Secondary | ICD-10-CM | POA: Diagnosis not present

## 2019-01-31 DIAGNOSIS — J4520 Mild intermittent asthma, uncomplicated: Secondary | ICD-10-CM | POA: Diagnosis not present

## 2019-01-31 DIAGNOSIS — E039 Hypothyroidism, unspecified: Secondary | ICD-10-CM | POA: Diagnosis not present

## 2019-02-02 ENCOUNTER — Other Ambulatory Visit: Payer: PPO

## 2019-02-07 ENCOUNTER — Other Ambulatory Visit: Payer: Self-pay

## 2019-02-07 ENCOUNTER — Ambulatory Visit
Admission: RE | Admit: 2019-02-07 | Discharge: 2019-02-07 | Disposition: A | Discharge status: Home / Self Care | Payer: PPO | Source: Ambulatory Visit | Attending: Nurse Practitioner | Admitting: Nurse Practitioner

## 2019-02-07 DIAGNOSIS — R109 Unspecified abdominal pain: Secondary | ICD-10-CM

## 2019-02-07 DIAGNOSIS — N200 Calculus of kidney: Secondary | ICD-10-CM | POA: Diagnosis not present

## 2019-02-07 MED ORDER — IOPAMIDOL (ISOVUE-300) INJECTION 61%
100.0000 mL | Freq: Once | INTRAVENOUS | Status: AC | PRN
  Administered 2019-02-07: 100 mL via INTRAVENOUS

## 2019-03-23 DIAGNOSIS — J4520 Mild intermittent asthma, uncomplicated: Secondary | ICD-10-CM | POA: Diagnosis not present

## 2019-03-23 DIAGNOSIS — F322 Major depressive disorder, single episode, severe without psychotic features: Secondary | ICD-10-CM | POA: Diagnosis not present

## 2019-03-23 DIAGNOSIS — G4700 Insomnia, unspecified: Secondary | ICD-10-CM | POA: Diagnosis not present

## 2019-03-23 DIAGNOSIS — E785 Hyperlipidemia, unspecified: Secondary | ICD-10-CM | POA: Diagnosis not present

## 2019-03-23 DIAGNOSIS — E039 Hypothyroidism, unspecified: Secondary | ICD-10-CM | POA: Diagnosis not present

## 2019-03-23 DIAGNOSIS — F324 Major depressive disorder, single episode, in partial remission: Secondary | ICD-10-CM | POA: Diagnosis not present

## 2019-03-23 DIAGNOSIS — J45909 Unspecified asthma, uncomplicated: Secondary | ICD-10-CM | POA: Diagnosis not present

## 2019-03-23 DIAGNOSIS — E782 Mixed hyperlipidemia: Secondary | ICD-10-CM | POA: Diagnosis not present

## 2019-03-23 DIAGNOSIS — E7800 Pure hypercholesterolemia, unspecified: Secondary | ICD-10-CM | POA: Diagnosis not present

## 2019-03-23 DIAGNOSIS — M8580 Other specified disorders of bone density and structure, unspecified site: Secondary | ICD-10-CM | POA: Diagnosis not present

## 2019-05-05 ENCOUNTER — Other Ambulatory Visit: Payer: Self-pay

## 2019-05-05 ENCOUNTER — Ambulatory Visit: Payer: PPO | Admitting: Podiatry

## 2019-05-05 ENCOUNTER — Encounter: Payer: Self-pay | Admitting: Podiatry

## 2019-05-05 DIAGNOSIS — L608 Other nail disorders: Secondary | ICD-10-CM | POA: Diagnosis not present

## 2019-05-05 DIAGNOSIS — L603 Nail dystrophy: Secondary | ICD-10-CM

## 2019-05-05 NOTE — Progress Notes (Signed)
  Subjective:  Patient ID: Katherine Morton, female    DOB: 1950/08/04,  MRN: [id number] HPI Chief Complaint  Patient presents with  . Nail Problem    Hallux nail left - thick, brittle, discolored x years, had right hallux nail removed for same problem, but has questions about laser treatment  . New Patient (Initial Visit)    69 y.o. female presents with the above complaint.   ROS: Denies fever chills nausea vomiting muscle aches pains calf pain back pain chest pain shortness of breath.  Past Medical History:  Diagnosis Date  . Anemia   . Hyperlipidemia   . Kidney stone   . Migraine headache   . Thyroid disease    Past Surgical History:  Procedure Laterality Date  . KNEE SURGERY      Current Outpatient Medications:  .  ARMOUR THYROID PO, Take by mouth., Disp: , Rfl:  .  B COMPLEX-C PO, Take by mouth., Disp: , Rfl:  .  BLACK CURRANT SEED OIL PO, Take by mouth., Disp: , Rfl:  .  COD LIVER OIL PO, Take by mouth., Disp: , Rfl:  .  GARLIC PO, Take by mouth., Disp: , Rfl:  .  Povidone K-30 POWD, by Does not apply route., Disp: , Rfl:  .  ZINC ACETATE EX, Apply topically., Disp: , Rfl:  .  Cholecalciferol (VITAMIN D PO), Take by mouth daily.  , Disp: , Rfl:  .  Multiple Vitamin (MULTIVITAMIN) capsule, Take 1 capsule by mouth daily.  , Disp: , Rfl:   Allergies  Allergen Reactions  . Ciprofloxacin Hcl Nausea And Vomiting  . Codeine Anxiety  . Maxalt [Rizatriptan Benzoate] Nausea And Vomiting  . Penicillins Nausea And Vomiting   Review of Systems Objective:   Vitals:   05/05/19 1011  BP: 118/78  Pulse: 66  Resp: 16  Temp: 98.1 F (36.7 C)    General: Well developed, nourished, in no acute distress, alert and oriented x3   Dermatological: Skin is warm, dry and supple bilateral. Nails x 10 are well maintained; remaining integument appears unremarkable at this time. There are no open sores, no preulcerative lesions, no rash or signs of infection present.  Hallux nail  right has been removed totally completely with no sign of nail at all.  Hallux left as demonstrated thick brittle possibly mycotic nail.  There is no surrounding soft tissue involvement.  Vascular: Dorsalis Pedis artery and Posterior Tibial artery pedal pulses are 2/4 bilateral with immedate capillary fill time. Pedal hair growth present. No varicosities and no lower extremity edema present bilateral.   Neruologic: Grossly intact via light touch bilateral. Vibratory intact via tuning fork bilateral. Protective threshold with Semmes Wienstein monofilament intact to all pedal sites bilateral. Patellar and Achilles deep tendon reflexes 2+ bilateral. No Babinski or clonus noted bilateral.   Musculoskeletal: No gross boney pedal deformities bilateral. No pain, crepitus, or limitation noted with foot and ankle range of motion bilateral. Muscular strength 5/5 in all groups tested bilateral.  Gait: Unassisted, Nonantalgic.    Radiographs:  None taken  Assessment & Plan:   Assessment: Nail dystrophy  Plan: Samples of the skin and nail were taken today to be sent for pathologic evaluation follow-up with me once those come in.     Burt Piatek T. [place], [place]

## 2019-05-26 ENCOUNTER — Telehealth: Payer: Self-pay | Admitting: *Deleted

## 2019-05-26 NOTE — Telephone Encounter (Signed)
I informed pt of Dr. Stephenie Acres review of results and orders. Pt states she will be in to have the toenail removed because it is breaking.

## 2019-05-26 NOTE — Telephone Encounter (Signed)
-----   Message from Garrel Ridgel, [place] sent at 05/25/2019  4:49 PM EDT ----- Negative for fungus so we dont need her to come in for this.

## 2019-06-02 ENCOUNTER — Ambulatory Visit: Payer: PPO | Admitting: Podiatry

## 2019-06-02 ENCOUNTER — Other Ambulatory Visit: Payer: Self-pay

## 2019-06-02 ENCOUNTER — Encounter: Payer: Self-pay | Admitting: Podiatry

## 2019-06-02 DIAGNOSIS — L603 Nail dystrophy: Secondary | ICD-10-CM | POA: Diagnosis not present

## 2019-06-02 MED ORDER — NEOMYCIN-POLYMYXIN-HC 1 % OT SOLN: OTIC | 1 refills | Status: AC

## 2019-06-02 NOTE — Progress Notes (Signed)
She presents here for follow-up today states that I did splint his nail removed in total.  Objective: Vital signs are stable she is alert and oriented x3.  There is no erythema edema cellulitis drainage odor she has a painful hallux nail left which came back negative for fungus on biopsy.  Assessment: Painful ingrown nail hallux left.  Plan: Total matrixectomy was performed today hallux left after local anesthetic was administered she tolerated procedure well without complications.  She was provided with both oral and written home-going instructions for the care and soaking of the toe.  She was also provided with a Corticosporin otic prescription which she will apply daily after soaking.  Follow-up with her in 2 weeks

## 2019-06-02 NOTE — Patient Instructions (Signed)

## 2019-06-21 ENCOUNTER — Other Ambulatory Visit: Payer: Self-pay

## 2019-06-21 ENCOUNTER — Ambulatory Visit (INDEPENDENT_AMBULATORY_CARE_PROVIDER_SITE_OTHER): Payer: PPO | Admitting: Podiatry

## 2019-06-21 ENCOUNTER — Encounter: Payer: Self-pay | Admitting: Podiatry

## 2019-06-21 DIAGNOSIS — L603 Nail dystrophy: Secondary | ICD-10-CM

## 2019-06-21 DIAGNOSIS — Z9889 Other specified postprocedural states: Secondary | ICD-10-CM

## 2019-06-21 NOTE — Progress Notes (Signed)
She presents today for follow-up of her nail avulsion hallux right.  States that is still tender and a little bit weepy but other than that is doing really well she continues to soak in Betadine and warm water twice daily and apply Corticosporin otic drops.  Objective: Vital signs are stable alert oriented x3 there is no erythema edema cellulitis or odor.  It appears to be healing very nicely epithelialization is occurring  Assessment: Well-healing surgical toe hallux right.  Plan: Recommended that she soak Epson salts and warm water on a daily basis to at least every other day continued to apply Cortisporin Otic drops cover with a Band-Aid during the day but leave open at bedtime.  If this does not heal within the next 2 to 3 weeks she is to notify us immediately if she feels that is becoming infected which we discussed in detail today she will notify us.  Otherwise follow-up as needed.

## 2019-06-29 DIAGNOSIS — E039 Hypothyroidism, unspecified: Secondary | ICD-10-CM | POA: Diagnosis not present

## 2019-06-29 DIAGNOSIS — F322 Major depressive disorder, single episode, severe without psychotic features: Secondary | ICD-10-CM | POA: Diagnosis not present

## 2019-06-29 DIAGNOSIS — J4520 Mild intermittent asthma, uncomplicated: Secondary | ICD-10-CM | POA: Diagnosis not present

## 2019-06-29 DIAGNOSIS — J45909 Unspecified asthma, uncomplicated: Secondary | ICD-10-CM | POA: Diagnosis not present

## 2019-06-29 DIAGNOSIS — E785 Hyperlipidemia, unspecified: Secondary | ICD-10-CM | POA: Diagnosis not present

## 2019-06-29 DIAGNOSIS — G4700 Insomnia, unspecified: Secondary | ICD-10-CM | POA: Diagnosis not present

## 2019-06-29 DIAGNOSIS — F324 Major depressive disorder, single episode, in partial remission: Secondary | ICD-10-CM | POA: Diagnosis not present

## 2019-06-29 DIAGNOSIS — E7800 Pure hypercholesterolemia, unspecified: Secondary | ICD-10-CM | POA: Diagnosis not present

## 2019-06-29 DIAGNOSIS — M8580 Other specified disorders of bone density and structure, unspecified site: Secondary | ICD-10-CM | POA: Diagnosis not present

## 2019-06-29 DIAGNOSIS — E782 Mixed hyperlipidemia: Secondary | ICD-10-CM | POA: Diagnosis not present

## 2019-07-09 IMAGING — MG DIGITAL SCREENING BILATERAL MAMMOGRAM WITH TOMO AND CAD
6 of 10 series · 6 of 30 positions shown · non-contrast
Comparison: Previous exam(s).

ACR Breast Density Category a: The breast tissue is almost entirely
fatty.

CLINICAL DATA: Screening.

EXAM:
DIGITAL SCREENING BILATERAL MAMMOGRAM WITH TOMO AND CAD

[R MLO synth-2D]
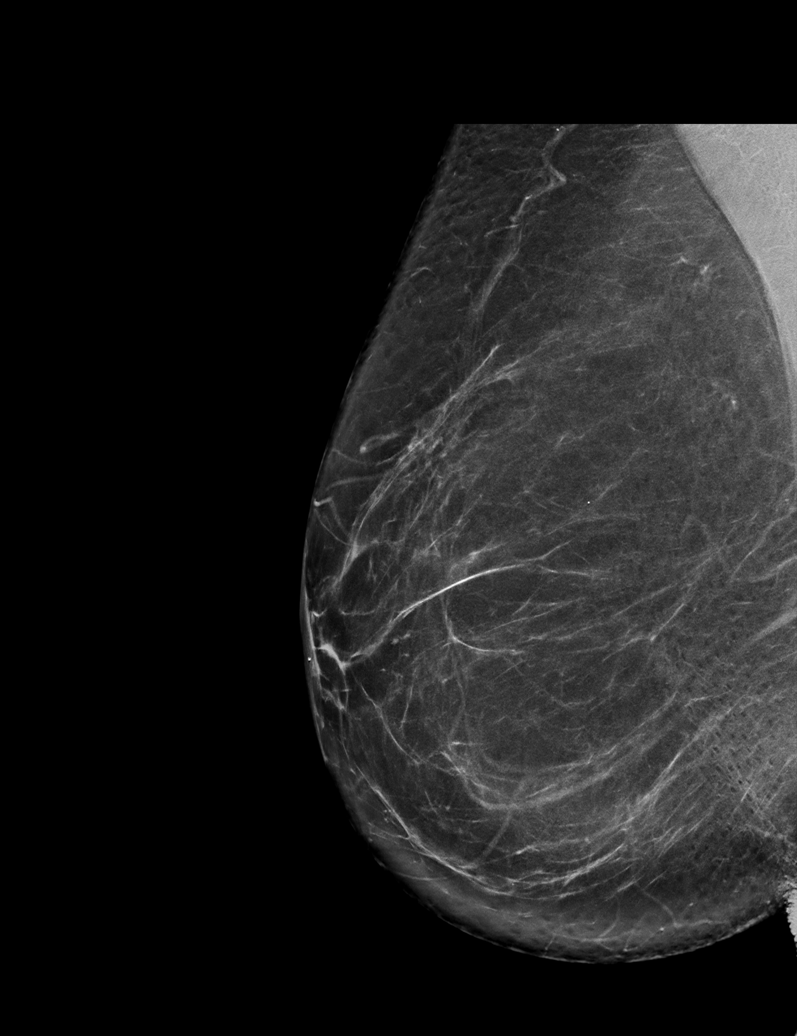

[L MLO synth-2D (1 of 2)]
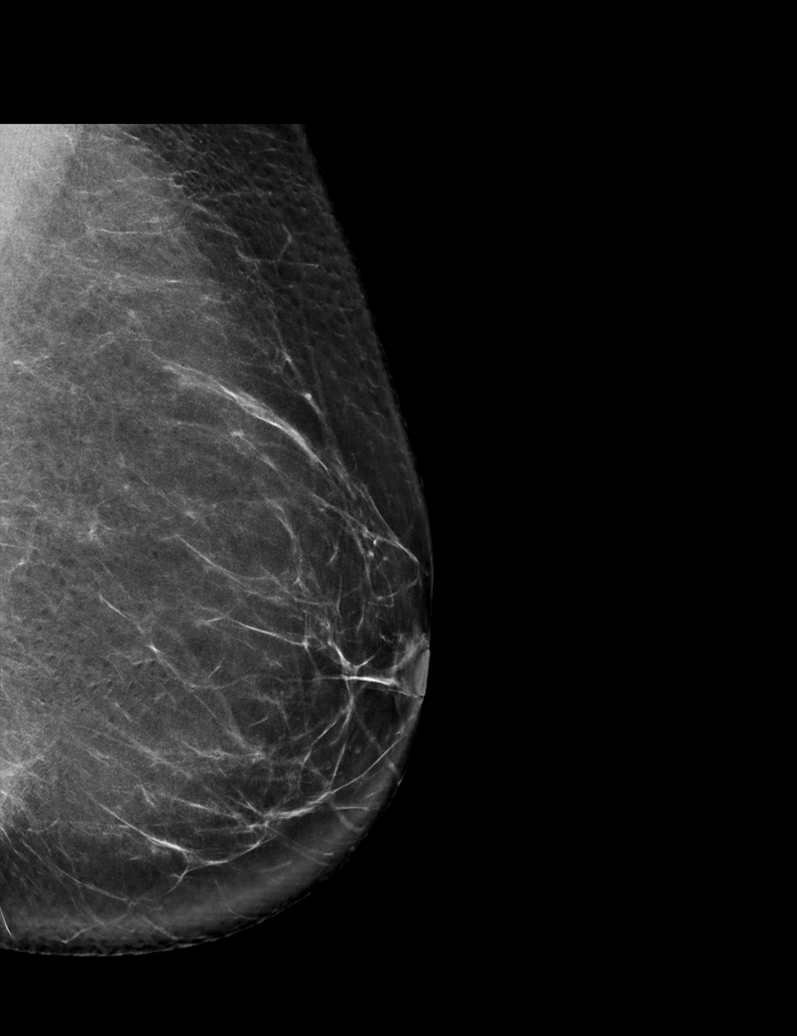

[L MLO synth-2D (2 of 2)]
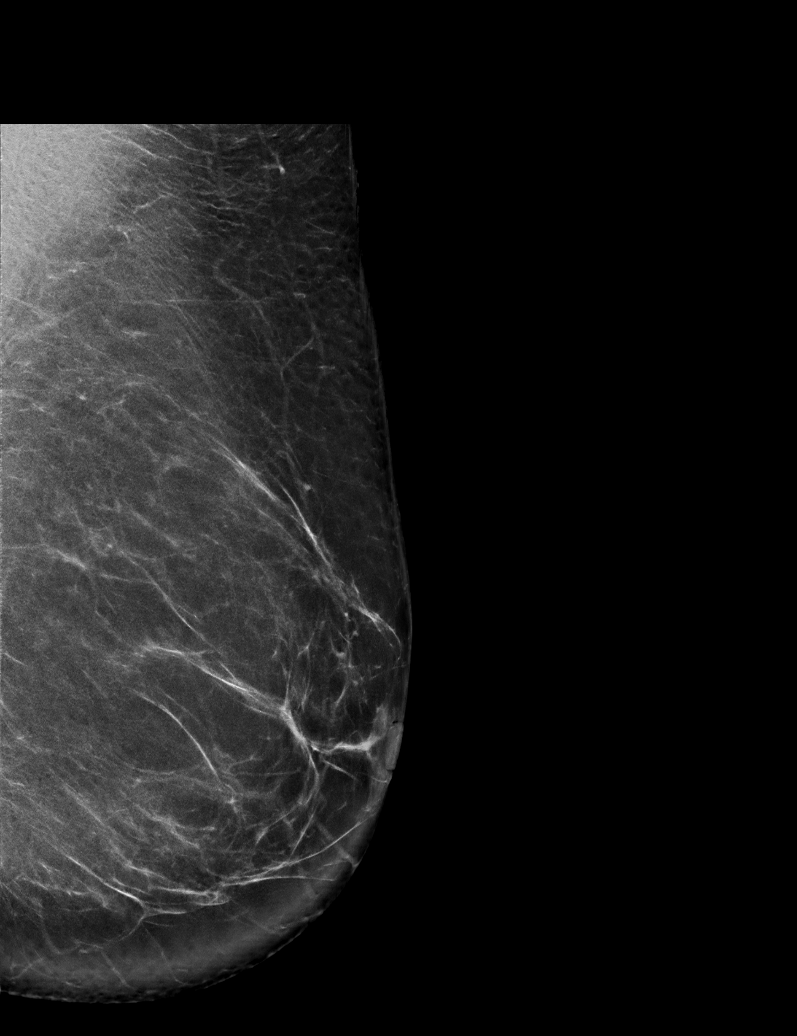

[L CC synth-2D]
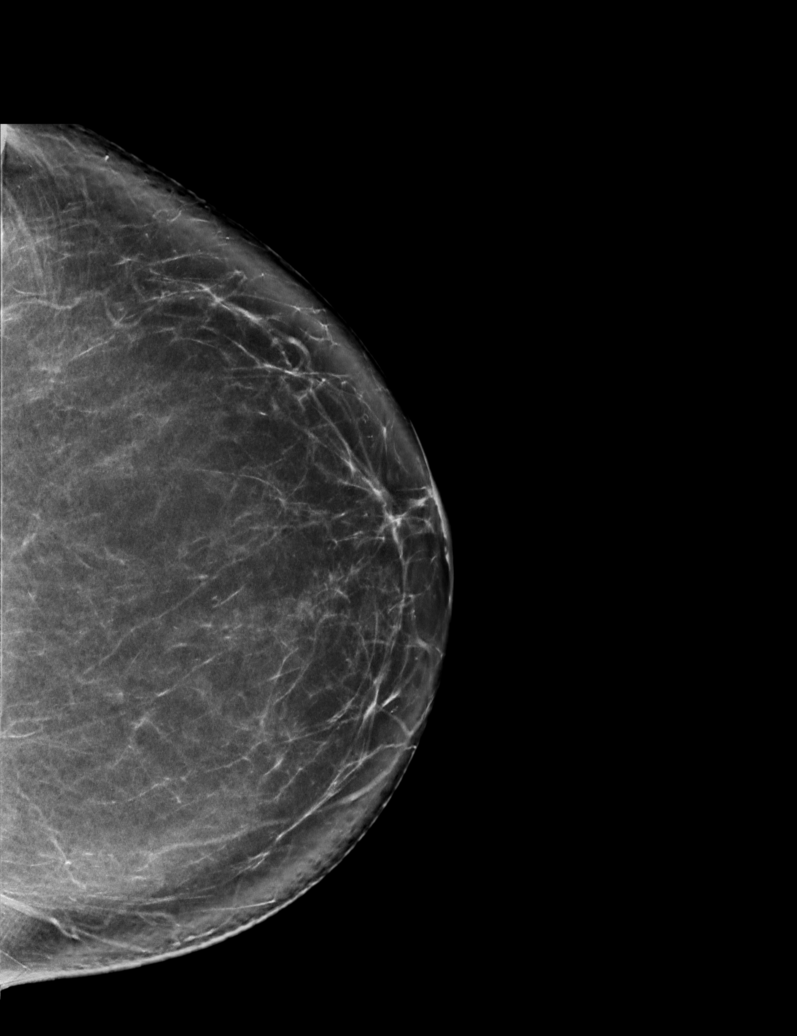

[R CC synth-2D]
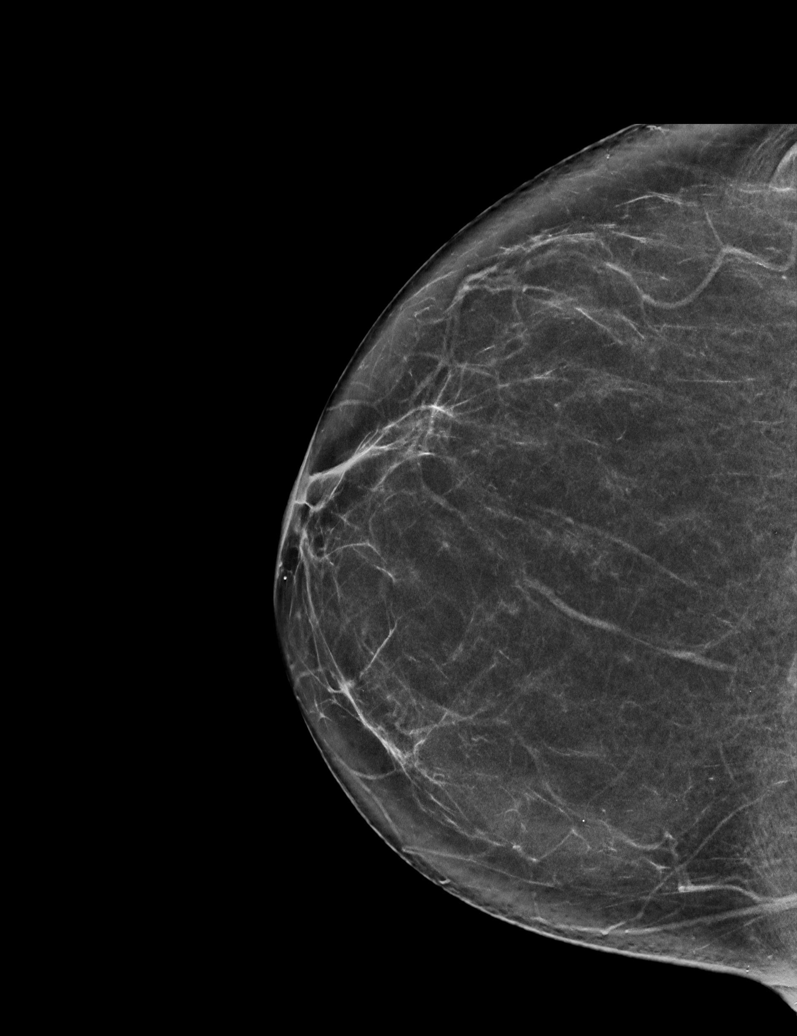

[L MLO tomo · tomo slice 43/86.0]
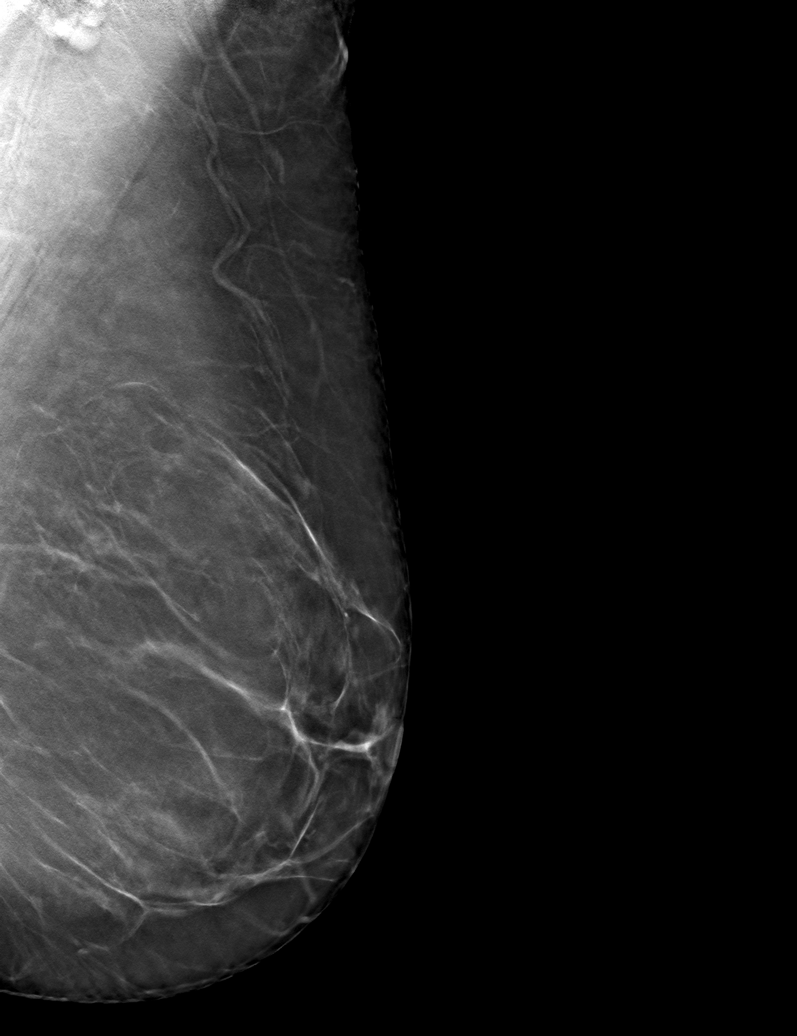

[6 of 30 positions shown; findings below may reference images not displayed]

FINDINGS: There are no findings suspicious for malignancy. Images were
processed with CAD.
IMPRESSION: No mammographic evidence of malignancy. A result letter of this
screening mammogram will be mailed directly to the patient.

RECOMMENDATION:
Screening mammogram in one year. (Code:[id number])

BI-RADS CATEGORY  1: Negative.

## 2019-07-20 DIAGNOSIS — E2839 Other primary ovarian failure: Secondary | ICD-10-CM | POA: Diagnosis not present

## 2019-07-20 DIAGNOSIS — Z0000 Encounter for general adult medical examination without abnormal findings: Secondary | ICD-10-CM | POA: Diagnosis not present

## 2019-07-20 DIAGNOSIS — E782 Mixed hyperlipidemia: Secondary | ICD-10-CM | POA: Diagnosis not present

## 2019-07-20 DIAGNOSIS — Z1389 Encounter for screening for other disorder: Secondary | ICD-10-CM | POA: Diagnosis not present

## 2019-07-20 DIAGNOSIS — E039 Hypothyroidism, unspecified: Secondary | ICD-10-CM | POA: Diagnosis not present

## 2019-07-20 DIAGNOSIS — J45909 Unspecified asthma, uncomplicated: Secondary | ICD-10-CM | POA: Diagnosis not present

## 2019-07-20 DIAGNOSIS — M8580 Other specified disorders of bone density and structure, unspecified site: Secondary | ICD-10-CM | POA: Diagnosis not present

## 2019-07-21 ENCOUNTER — Other Ambulatory Visit: Payer: Self-pay | Admitting: Family Medicine

## 2019-07-21 DIAGNOSIS — E2839 Other primary ovarian failure: Secondary | ICD-10-CM

## 2019-07-29 ENCOUNTER — Ambulatory Visit: Payer: PPO | Admitting: Podiatry

## 2019-08-03 DIAGNOSIS — H43813 Vitreous degeneration, bilateral: Secondary | ICD-10-CM | POA: Diagnosis not present

## 2019-08-03 DIAGNOSIS — H10413 Chronic giant papillary conjunctivitis, bilateral: Secondary | ICD-10-CM | POA: Diagnosis not present

## 2019-08-03 DIAGNOSIS — H04123 Dry eye syndrome of bilateral lacrimal glands: Secondary | ICD-10-CM | POA: Diagnosis not present

## 2019-08-03 DIAGNOSIS — D492 Neoplasm of unspecified behavior of bone, soft tissue, and skin: Secondary | ICD-10-CM | POA: Diagnosis not present

## 2019-08-03 DIAGNOSIS — H2513 Age-related nuclear cataract, bilateral: Secondary | ICD-10-CM | POA: Diagnosis not present

## 2019-08-03 DIAGNOSIS — H11042 Peripheral pterygium, stationary, left eye: Secondary | ICD-10-CM | POA: Diagnosis not present

## 2019-08-23 ENCOUNTER — Ambulatory Visit: Payer: PPO | Admitting: Podiatry

## 2019-08-30 DIAGNOSIS — E7800 Pure hypercholesterolemia, unspecified: Secondary | ICD-10-CM | POA: Diagnosis not present

## 2019-08-30 DIAGNOSIS — E039 Hypothyroidism, unspecified: Secondary | ICD-10-CM | POA: Diagnosis not present

## 2019-10-11 ENCOUNTER — Other Ambulatory Visit: Payer: PPO

## 2019-10-11 DIAGNOSIS — E7800 Pure hypercholesterolemia, unspecified: Secondary | ICD-10-CM | POA: Diagnosis not present

## 2019-10-11 DIAGNOSIS — F322 Major depressive disorder, single episode, severe without psychotic features: Secondary | ICD-10-CM | POA: Diagnosis not present

## 2019-10-11 DIAGNOSIS — E785 Hyperlipidemia, unspecified: Secondary | ICD-10-CM | POA: Diagnosis not present

## 2019-10-11 DIAGNOSIS — E782 Mixed hyperlipidemia: Secondary | ICD-10-CM | POA: Diagnosis not present

## 2019-10-11 DIAGNOSIS — J45909 Unspecified asthma, uncomplicated: Secondary | ICD-10-CM | POA: Diagnosis not present

## 2019-10-11 DIAGNOSIS — F324 Major depressive disorder, single episode, in partial remission: Secondary | ICD-10-CM | POA: Diagnosis not present

## 2019-10-11 DIAGNOSIS — E039 Hypothyroidism, unspecified: Secondary | ICD-10-CM | POA: Diagnosis not present

## 2019-10-11 DIAGNOSIS — M8580 Other specified disorders of bone density and structure, unspecified site: Secondary | ICD-10-CM | POA: Diagnosis not present

## 2019-10-11 DIAGNOSIS — J4520 Mild intermittent asthma, uncomplicated: Secondary | ICD-10-CM | POA: Diagnosis not present

## 2019-10-11 DIAGNOSIS — G4700 Insomnia, unspecified: Secondary | ICD-10-CM | POA: Diagnosis not present

## 2019-11-28 DIAGNOSIS — E782 Mixed hyperlipidemia: Secondary | ICD-10-CM | POA: Diagnosis not present

## 2019-11-28 DIAGNOSIS — F324 Major depressive disorder, single episode, in partial remission: Secondary | ICD-10-CM | POA: Diagnosis not present

## 2019-11-28 DIAGNOSIS — M8580 Other specified disorders of bone density and structure, unspecified site: Secondary | ICD-10-CM | POA: Diagnosis not present

## 2019-11-28 DIAGNOSIS — E039 Hypothyroidism, unspecified: Secondary | ICD-10-CM | POA: Diagnosis not present

## 2019-11-28 DIAGNOSIS — J45909 Unspecified asthma, uncomplicated: Secondary | ICD-10-CM | POA: Diagnosis not present

## 2019-11-28 DIAGNOSIS — J4520 Mild intermittent asthma, uncomplicated: Secondary | ICD-10-CM | POA: Diagnosis not present

## 2019-11-28 DIAGNOSIS — F322 Major depressive disorder, single episode, severe without psychotic features: Secondary | ICD-10-CM | POA: Diagnosis not present

## 2019-11-28 DIAGNOSIS — E785 Hyperlipidemia, unspecified: Secondary | ICD-10-CM | POA: Diagnosis not present

## 2019-11-28 DIAGNOSIS — E7800 Pure hypercholesterolemia, unspecified: Secondary | ICD-10-CM | POA: Diagnosis not present

## 2019-11-28 DIAGNOSIS — G4700 Insomnia, unspecified: Secondary | ICD-10-CM | POA: Diagnosis not present

## 2019-11-29 DIAGNOSIS — K5792 Diverticulitis of intestine, part unspecified, without perforation or abscess without bleeding: Secondary | ICD-10-CM | POA: Diagnosis not present

## 2019-12-05 DIAGNOSIS — M7912 Myalgia of auxiliary muscles, head and neck: Secondary | ICD-10-CM | POA: Diagnosis not present

## 2019-12-05 DIAGNOSIS — M5412 Radiculopathy, cervical region: Secondary | ICD-10-CM | POA: Diagnosis not present

## 2019-12-05 DIAGNOSIS — M9901 Segmental and somatic dysfunction of cervical region: Secondary | ICD-10-CM | POA: Diagnosis not present

## 2019-12-05 DIAGNOSIS — M5413 Radiculopathy, cervicothoracic region: Secondary | ICD-10-CM | POA: Diagnosis not present

## 2019-12-06 DIAGNOSIS — M9901 Segmental and somatic dysfunction of cervical region: Secondary | ICD-10-CM | POA: Diagnosis not present

## 2019-12-06 DIAGNOSIS — M7912 Myalgia of auxiliary muscles, head and neck: Secondary | ICD-10-CM | POA: Diagnosis not present

## 2019-12-06 DIAGNOSIS — M5412 Radiculopathy, cervical region: Secondary | ICD-10-CM | POA: Diagnosis not present

## 2019-12-06 DIAGNOSIS — M5413 Radiculopathy, cervicothoracic region: Secondary | ICD-10-CM | POA: Diagnosis not present

## 2019-12-06 DIAGNOSIS — M546 Pain in thoracic spine: Secondary | ICD-10-CM | POA: Diagnosis not present

## 2019-12-13 DIAGNOSIS — E785 Hyperlipidemia, unspecified: Secondary | ICD-10-CM | POA: Diagnosis not present

## 2019-12-13 DIAGNOSIS — K5792 Diverticulitis of intestine, part unspecified, without perforation or abscess without bleeding: Secondary | ICD-10-CM | POA: Diagnosis not present

## 2019-12-13 DIAGNOSIS — E039 Hypothyroidism, unspecified: Secondary | ICD-10-CM | POA: Diagnosis not present

## 2019-12-13 DIAGNOSIS — R319 Hematuria, unspecified: Secondary | ICD-10-CM | POA: Diagnosis not present

## 2019-12-15 DIAGNOSIS — M7912 Myalgia of auxiliary muscles, head and neck: Secondary | ICD-10-CM | POA: Diagnosis not present

## 2019-12-15 DIAGNOSIS — M9901 Segmental and somatic dysfunction of cervical region: Secondary | ICD-10-CM | POA: Diagnosis not present

## 2019-12-15 DIAGNOSIS — M5413 Radiculopathy, cervicothoracic region: Secondary | ICD-10-CM | POA: Diagnosis not present

## 2019-12-15 DIAGNOSIS — M546 Pain in thoracic spine: Secondary | ICD-10-CM | POA: Diagnosis not present

## 2019-12-15 DIAGNOSIS — M5412 Radiculopathy, cervical region: Secondary | ICD-10-CM | POA: Diagnosis not present

## 2019-12-19 DIAGNOSIS — M9901 Segmental and somatic dysfunction of cervical region: Secondary | ICD-10-CM | POA: Diagnosis not present

## 2019-12-19 DIAGNOSIS — M5413 Radiculopathy, cervicothoracic region: Secondary | ICD-10-CM | POA: Diagnosis not present

## 2019-12-19 DIAGNOSIS — M546 Pain in thoracic spine: Secondary | ICD-10-CM | POA: Diagnosis not present

## 2019-12-19 DIAGNOSIS — M5412 Radiculopathy, cervical region: Secondary | ICD-10-CM | POA: Diagnosis not present

## 2019-12-19 DIAGNOSIS — M7912 Myalgia of auxiliary muscles, head and neck: Secondary | ICD-10-CM | POA: Diagnosis not present

## 2019-12-20 DIAGNOSIS — B373 Candidiasis of vulva and vagina: Secondary | ICD-10-CM | POA: Diagnosis not present

## 2019-12-20 DIAGNOSIS — N950 Postmenopausal bleeding: Secondary | ICD-10-CM | POA: Diagnosis not present

## 2019-12-22 DIAGNOSIS — M5412 Radiculopathy, cervical region: Secondary | ICD-10-CM | POA: Diagnosis not present

## 2019-12-22 DIAGNOSIS — N950 Postmenopausal bleeding: Secondary | ICD-10-CM | POA: Diagnosis not present

## 2019-12-22 DIAGNOSIS — M7912 Myalgia of auxiliary muscles, head and neck: Secondary | ICD-10-CM | POA: Diagnosis not present

## 2019-12-22 DIAGNOSIS — M5413 Radiculopathy, cervicothoracic region: Secondary | ICD-10-CM | POA: Diagnosis not present

## 2019-12-22 DIAGNOSIS — M546 Pain in thoracic spine: Secondary | ICD-10-CM | POA: Diagnosis not present

## 2019-12-22 DIAGNOSIS — M9901 Segmental and somatic dysfunction of cervical region: Secondary | ICD-10-CM | POA: Diagnosis not present

## 2019-12-28 DIAGNOSIS — M7912 Myalgia of auxiliary muscles, head and neck: Secondary | ICD-10-CM | POA: Diagnosis not present

## 2019-12-28 DIAGNOSIS — M546 Pain in thoracic spine: Secondary | ICD-10-CM | POA: Diagnosis not present

## 2019-12-28 DIAGNOSIS — M5412 Radiculopathy, cervical region: Secondary | ICD-10-CM | POA: Diagnosis not present

## 2019-12-28 DIAGNOSIS — M5413 Radiculopathy, cervicothoracic region: Secondary | ICD-10-CM | POA: Diagnosis not present

## 2019-12-28 DIAGNOSIS — M9901 Segmental and somatic dysfunction of cervical region: Secondary | ICD-10-CM | POA: Diagnosis not present

## 2020-01-02 DIAGNOSIS — M5413 Radiculopathy, cervicothoracic region: Secondary | ICD-10-CM | POA: Diagnosis not present

## 2020-01-02 DIAGNOSIS — M546 Pain in thoracic spine: Secondary | ICD-10-CM | POA: Diagnosis not present

## 2020-01-02 DIAGNOSIS — M5412 Radiculopathy, cervical region: Secondary | ICD-10-CM | POA: Diagnosis not present

## 2020-01-02 DIAGNOSIS — M9901 Segmental and somatic dysfunction of cervical region: Secondary | ICD-10-CM | POA: Diagnosis not present

## 2020-01-02 DIAGNOSIS — M7912 Myalgia of auxiliary muscles, head and neck: Secondary | ICD-10-CM | POA: Diagnosis not present

## 2020-02-02 DIAGNOSIS — G4700 Insomnia, unspecified: Secondary | ICD-10-CM | POA: Diagnosis not present

## 2020-02-02 DIAGNOSIS — E039 Hypothyroidism, unspecified: Secondary | ICD-10-CM | POA: Diagnosis not present

## 2020-02-02 DIAGNOSIS — M8580 Other specified disorders of bone density and structure, unspecified site: Secondary | ICD-10-CM | POA: Diagnosis not present

## 2020-02-02 DIAGNOSIS — F322 Major depressive disorder, single episode, severe without psychotic features: Secondary | ICD-10-CM | POA: Diagnosis not present

## 2020-02-02 DIAGNOSIS — E7800 Pure hypercholesterolemia, unspecified: Secondary | ICD-10-CM | POA: Diagnosis not present

## 2020-02-02 DIAGNOSIS — E785 Hyperlipidemia, unspecified: Secondary | ICD-10-CM | POA: Diagnosis not present

## 2020-02-02 DIAGNOSIS — F324 Major depressive disorder, single episode, in partial remission: Secondary | ICD-10-CM | POA: Diagnosis not present

## 2020-02-02 DIAGNOSIS — E782 Mixed hyperlipidemia: Secondary | ICD-10-CM | POA: Diagnosis not present

## 2020-02-02 DIAGNOSIS — J4520 Mild intermittent asthma, uncomplicated: Secondary | ICD-10-CM | POA: Diagnosis not present

## 2020-02-02 DIAGNOSIS — J45909 Unspecified asthma, uncomplicated: Secondary | ICD-10-CM | POA: Diagnosis not present

## 2020-04-10 DIAGNOSIS — J45909 Unspecified asthma, uncomplicated: Secondary | ICD-10-CM | POA: Diagnosis not present

## 2020-04-10 DIAGNOSIS — G4700 Insomnia, unspecified: Secondary | ICD-10-CM | POA: Diagnosis not present

## 2020-04-10 DIAGNOSIS — F324 Major depressive disorder, single episode, in partial remission: Secondary | ICD-10-CM | POA: Diagnosis not present

## 2020-04-10 DIAGNOSIS — E039 Hypothyroidism, unspecified: Secondary | ICD-10-CM | POA: Diagnosis not present

## 2020-04-10 DIAGNOSIS — E785 Hyperlipidemia, unspecified: Secondary | ICD-10-CM | POA: Diagnosis not present

## 2020-04-10 DIAGNOSIS — M8580 Other specified disorders of bone density and structure, unspecified site: Secondary | ICD-10-CM | POA: Diagnosis not present

## 2020-04-10 DIAGNOSIS — E782 Mixed hyperlipidemia: Secondary | ICD-10-CM | POA: Diagnosis not present

## 2020-04-10 DIAGNOSIS — J4520 Mild intermittent asthma, uncomplicated: Secondary | ICD-10-CM | POA: Diagnosis not present

## 2020-04-12 DIAGNOSIS — E039 Hypothyroidism, unspecified: Secondary | ICD-10-CM | POA: Diagnosis not present

## 2020-07-10 DIAGNOSIS — M9900 Segmental and somatic dysfunction of head region: Secondary | ICD-10-CM | POA: Diagnosis not present

## 2020-07-10 DIAGNOSIS — M62830 Muscle spasm of back: Secondary | ICD-10-CM | POA: Diagnosis not present

## 2020-07-10 DIAGNOSIS — M542 Cervicalgia: Secondary | ICD-10-CM | POA: Diagnosis not present

## 2020-07-10 DIAGNOSIS — M9901 Segmental and somatic dysfunction of cervical region: Secondary | ICD-10-CM | POA: Diagnosis not present

## 2020-07-11 DIAGNOSIS — M542 Cervicalgia: Secondary | ICD-10-CM | POA: Diagnosis not present

## 2020-07-11 DIAGNOSIS — M9901 Segmental and somatic dysfunction of cervical region: Secondary | ICD-10-CM | POA: Diagnosis not present

## 2020-07-11 DIAGNOSIS — M9900 Segmental and somatic dysfunction of head region: Secondary | ICD-10-CM | POA: Diagnosis not present

## 2020-07-11 DIAGNOSIS — M62830 Muscle spasm of back: Secondary | ICD-10-CM | POA: Diagnosis not present

## 2020-07-17 DIAGNOSIS — M62830 Muscle spasm of back: Secondary | ICD-10-CM | POA: Diagnosis not present

## 2020-07-17 DIAGNOSIS — M9900 Segmental and somatic dysfunction of head region: Secondary | ICD-10-CM | POA: Diagnosis not present

## 2020-07-17 DIAGNOSIS — M542 Cervicalgia: Secondary | ICD-10-CM | POA: Diagnosis not present

## 2020-07-17 DIAGNOSIS — M9901 Segmental and somatic dysfunction of cervical region: Secondary | ICD-10-CM | POA: Diagnosis not present

## 2020-07-18 DIAGNOSIS — M542 Cervicalgia: Secondary | ICD-10-CM | POA: Diagnosis not present

## 2020-07-18 DIAGNOSIS — M62830 Muscle spasm of back: Secondary | ICD-10-CM | POA: Diagnosis not present

## 2020-07-18 DIAGNOSIS — M9901 Segmental and somatic dysfunction of cervical region: Secondary | ICD-10-CM | POA: Diagnosis not present

## 2020-07-18 DIAGNOSIS — M9900 Segmental and somatic dysfunction of head region: Secondary | ICD-10-CM | POA: Diagnosis not present

## 2020-07-19 DIAGNOSIS — M9901 Segmental and somatic dysfunction of cervical region: Secondary | ICD-10-CM | POA: Diagnosis not present

## 2020-07-19 DIAGNOSIS — M62830 Muscle spasm of back: Secondary | ICD-10-CM | POA: Diagnosis not present

## 2020-07-19 DIAGNOSIS — M9900 Segmental and somatic dysfunction of head region: Secondary | ICD-10-CM | POA: Diagnosis not present

## 2020-07-19 DIAGNOSIS — M542 Cervicalgia: Secondary | ICD-10-CM | POA: Diagnosis not present

## 2020-07-24 DIAGNOSIS — M542 Cervicalgia: Secondary | ICD-10-CM | POA: Diagnosis not present

## 2020-07-24 DIAGNOSIS — M62830 Muscle spasm of back: Secondary | ICD-10-CM | POA: Diagnosis not present

## 2020-07-24 DIAGNOSIS — M9901 Segmental and somatic dysfunction of cervical region: Secondary | ICD-10-CM | POA: Diagnosis not present

## 2020-07-24 DIAGNOSIS — M9900 Segmental and somatic dysfunction of head region: Secondary | ICD-10-CM | POA: Diagnosis not present

## 2020-07-26 DIAGNOSIS — J4520 Mild intermittent asthma, uncomplicated: Secondary | ICD-10-CM | POA: Diagnosis not present

## 2020-07-26 DIAGNOSIS — F324 Major depressive disorder, single episode, in partial remission: Secondary | ICD-10-CM | POA: Diagnosis not present

## 2020-07-26 DIAGNOSIS — F322 Major depressive disorder, single episode, severe without psychotic features: Secondary | ICD-10-CM | POA: Diagnosis not present

## 2020-07-26 DIAGNOSIS — E7800 Pure hypercholesterolemia, unspecified: Secondary | ICD-10-CM | POA: Diagnosis not present

## 2020-07-26 DIAGNOSIS — E785 Hyperlipidemia, unspecified: Secondary | ICD-10-CM | POA: Diagnosis not present

## 2020-07-26 DIAGNOSIS — M8580 Other specified disorders of bone density and structure, unspecified site: Secondary | ICD-10-CM | POA: Diagnosis not present

## 2020-07-26 DIAGNOSIS — G4700 Insomnia, unspecified: Secondary | ICD-10-CM | POA: Diagnosis not present

## 2020-07-26 DIAGNOSIS — E039 Hypothyroidism, unspecified: Secondary | ICD-10-CM | POA: Diagnosis not present

## 2020-07-26 DIAGNOSIS — E782 Mixed hyperlipidemia: Secondary | ICD-10-CM | POA: Diagnosis not present

## 2020-07-26 DIAGNOSIS — J45909 Unspecified asthma, uncomplicated: Secondary | ICD-10-CM | POA: Diagnosis not present

## 2020-08-13 DIAGNOSIS — E039 Hypothyroidism, unspecified: Secondary | ICD-10-CM | POA: Diagnosis not present

## 2020-08-23 DIAGNOSIS — M8580 Other specified disorders of bone density and structure, unspecified site: Secondary | ICD-10-CM | POA: Diagnosis not present

## 2020-08-23 DIAGNOSIS — E2839 Other primary ovarian failure: Secondary | ICD-10-CM | POA: Diagnosis not present

## 2020-08-23 DIAGNOSIS — E782 Mixed hyperlipidemia: Secondary | ICD-10-CM | POA: Diagnosis not present

## 2020-08-23 DIAGNOSIS — E039 Hypothyroidism, unspecified: Secondary | ICD-10-CM | POA: Diagnosis not present

## 2020-08-23 DIAGNOSIS — J45909 Unspecified asthma, uncomplicated: Secondary | ICD-10-CM | POA: Diagnosis not present

## 2020-08-23 DIAGNOSIS — Z1389 Encounter for screening for other disorder: Secondary | ICD-10-CM | POA: Diagnosis not present

## 2020-08-23 DIAGNOSIS — Z0000 Encounter for general adult medical examination without abnormal findings: Secondary | ICD-10-CM | POA: Diagnosis not present

## 2020-08-27 ENCOUNTER — Other Ambulatory Visit: Payer: Self-pay | Admitting: Family Medicine

## 2020-08-27 DIAGNOSIS — E2839 Other primary ovarian failure: Secondary | ICD-10-CM

## 2020-09-03 DIAGNOSIS — F322 Major depressive disorder, single episode, severe without psychotic features: Secondary | ICD-10-CM | POA: Diagnosis not present

## 2020-09-03 DIAGNOSIS — J45909 Unspecified asthma, uncomplicated: Secondary | ICD-10-CM | POA: Diagnosis not present

## 2020-09-03 DIAGNOSIS — M8580 Other specified disorders of bone density and structure, unspecified site: Secondary | ICD-10-CM | POA: Diagnosis not present

## 2020-09-03 DIAGNOSIS — E782 Mixed hyperlipidemia: Secondary | ICD-10-CM | POA: Diagnosis not present

## 2020-09-03 DIAGNOSIS — E785 Hyperlipidemia, unspecified: Secondary | ICD-10-CM | POA: Diagnosis not present

## 2020-09-03 DIAGNOSIS — F324 Major depressive disorder, single episode, in partial remission: Secondary | ICD-10-CM | POA: Diagnosis not present

## 2020-09-03 DIAGNOSIS — E7800 Pure hypercholesterolemia, unspecified: Secondary | ICD-10-CM | POA: Diagnosis not present

## 2020-09-03 DIAGNOSIS — G4700 Insomnia, unspecified: Secondary | ICD-10-CM | POA: Diagnosis not present

## 2020-09-03 DIAGNOSIS — E039 Hypothyroidism, unspecified: Secondary | ICD-10-CM | POA: Diagnosis not present

## 2020-09-03 DIAGNOSIS — J4520 Mild intermittent asthma, uncomplicated: Secondary | ICD-10-CM | POA: Diagnosis not present

## 2020-09-04 DIAGNOSIS — M542 Cervicalgia: Secondary | ICD-10-CM | POA: Diagnosis not present

## 2020-09-04 DIAGNOSIS — M62830 Muscle spasm of back: Secondary | ICD-10-CM | POA: Diagnosis not present

## 2020-09-04 DIAGNOSIS — M9901 Segmental and somatic dysfunction of cervical region: Secondary | ICD-10-CM | POA: Diagnosis not present

## 2020-09-04 DIAGNOSIS — M9900 Segmental and somatic dysfunction of head region: Secondary | ICD-10-CM | POA: Diagnosis not present

## 2020-09-10 DIAGNOSIS — M9901 Segmental and somatic dysfunction of cervical region: Secondary | ICD-10-CM | POA: Diagnosis not present

## 2020-09-10 DIAGNOSIS — M62830 Muscle spasm of back: Secondary | ICD-10-CM | POA: Diagnosis not present

## 2020-09-10 DIAGNOSIS — M542 Cervicalgia: Secondary | ICD-10-CM | POA: Diagnosis not present

## 2020-09-10 DIAGNOSIS — M9900 Segmental and somatic dysfunction of head region: Secondary | ICD-10-CM | POA: Diagnosis not present

## 2020-09-11 DIAGNOSIS — M9900 Segmental and somatic dysfunction of head region: Secondary | ICD-10-CM | POA: Diagnosis not present

## 2020-09-11 DIAGNOSIS — M62830 Muscle spasm of back: Secondary | ICD-10-CM | POA: Diagnosis not present

## 2020-09-11 DIAGNOSIS — M9901 Segmental and somatic dysfunction of cervical region: Secondary | ICD-10-CM | POA: Diagnosis not present

## 2020-09-11 DIAGNOSIS — M542 Cervicalgia: Secondary | ICD-10-CM | POA: Diagnosis not present

## 2020-09-25 DIAGNOSIS — M9900 Segmental and somatic dysfunction of head region: Secondary | ICD-10-CM | POA: Diagnosis not present

## 2020-09-25 DIAGNOSIS — M62830 Muscle spasm of back: Secondary | ICD-10-CM | POA: Diagnosis not present

## 2020-09-25 DIAGNOSIS — M9901 Segmental and somatic dysfunction of cervical region: Secondary | ICD-10-CM | POA: Diagnosis not present

## 2020-09-25 DIAGNOSIS — M542 Cervicalgia: Secondary | ICD-10-CM | POA: Diagnosis not present

## 2020-09-26 DIAGNOSIS — M542 Cervicalgia: Secondary | ICD-10-CM | POA: Diagnosis not present

## 2020-09-26 DIAGNOSIS — M9900 Segmental and somatic dysfunction of head region: Secondary | ICD-10-CM | POA: Diagnosis not present

## 2020-09-26 DIAGNOSIS — M9901 Segmental and somatic dysfunction of cervical region: Secondary | ICD-10-CM | POA: Diagnosis not present

## 2020-09-26 DIAGNOSIS — M62830 Muscle spasm of back: Secondary | ICD-10-CM | POA: Diagnosis not present

## 2020-11-13 DIAGNOSIS — M62830 Muscle spasm of back: Secondary | ICD-10-CM | POA: Diagnosis not present

## 2020-11-13 DIAGNOSIS — M9901 Segmental and somatic dysfunction of cervical region: Secondary | ICD-10-CM | POA: Diagnosis not present

## 2020-11-13 DIAGNOSIS — M9900 Segmental and somatic dysfunction of head region: Secondary | ICD-10-CM | POA: Diagnosis not present

## 2020-11-13 DIAGNOSIS — M542 Cervicalgia: Secondary | ICD-10-CM | POA: Diagnosis not present

## 2020-11-14 DIAGNOSIS — M9900 Segmental and somatic dysfunction of head region: Secondary | ICD-10-CM | POA: Diagnosis not present

## 2020-11-14 DIAGNOSIS — M542 Cervicalgia: Secondary | ICD-10-CM | POA: Diagnosis not present

## 2020-11-14 DIAGNOSIS — M9901 Segmental and somatic dysfunction of cervical region: Secondary | ICD-10-CM | POA: Diagnosis not present

## 2020-11-14 DIAGNOSIS — M62830 Muscle spasm of back: Secondary | ICD-10-CM | POA: Diagnosis not present

## 2020-11-20 DIAGNOSIS — M542 Cervicalgia: Secondary | ICD-10-CM | POA: Diagnosis not present

## 2020-11-20 DIAGNOSIS — M62830 Muscle spasm of back: Secondary | ICD-10-CM | POA: Diagnosis not present

## 2020-11-20 DIAGNOSIS — M9901 Segmental and somatic dysfunction of cervical region: Secondary | ICD-10-CM | POA: Diagnosis not present

## 2020-11-20 DIAGNOSIS — M9900 Segmental and somatic dysfunction of head region: Secondary | ICD-10-CM | POA: Diagnosis not present

## 2020-11-21 DIAGNOSIS — M542 Cervicalgia: Secondary | ICD-10-CM | POA: Diagnosis not present

## 2020-11-21 DIAGNOSIS — M9901 Segmental and somatic dysfunction of cervical region: Secondary | ICD-10-CM | POA: Diagnosis not present

## 2020-11-21 DIAGNOSIS — M9900 Segmental and somatic dysfunction of head region: Secondary | ICD-10-CM | POA: Diagnosis not present

## 2020-11-21 DIAGNOSIS — M62830 Muscle spasm of back: Secondary | ICD-10-CM | POA: Diagnosis not present

## 2020-12-24 DIAGNOSIS — M9900 Segmental and somatic dysfunction of head region: Secondary | ICD-10-CM | POA: Diagnosis not present

## 2020-12-24 DIAGNOSIS — M62830 Muscle spasm of back: Secondary | ICD-10-CM | POA: Diagnosis not present

## 2020-12-24 DIAGNOSIS — M542 Cervicalgia: Secondary | ICD-10-CM | POA: Diagnosis not present

## 2020-12-24 DIAGNOSIS — M9901 Segmental and somatic dysfunction of cervical region: Secondary | ICD-10-CM | POA: Diagnosis not present

## 2020-12-25 DIAGNOSIS — M9900 Segmental and somatic dysfunction of head region: Secondary | ICD-10-CM | POA: Diagnosis not present

## 2020-12-25 DIAGNOSIS — M9901 Segmental and somatic dysfunction of cervical region: Secondary | ICD-10-CM | POA: Diagnosis not present

## 2020-12-25 DIAGNOSIS — M62830 Muscle spasm of back: Secondary | ICD-10-CM | POA: Diagnosis not present

## 2020-12-25 DIAGNOSIS — M542 Cervicalgia: Secondary | ICD-10-CM | POA: Diagnosis not present

## 2020-12-26 DIAGNOSIS — M542 Cervicalgia: Secondary | ICD-10-CM | POA: Diagnosis not present

## 2020-12-26 DIAGNOSIS — M9901 Segmental and somatic dysfunction of cervical region: Secondary | ICD-10-CM | POA: Diagnosis not present

## 2020-12-26 DIAGNOSIS — M9900 Segmental and somatic dysfunction of head region: Secondary | ICD-10-CM | POA: Diagnosis not present

## 2020-12-26 DIAGNOSIS — M62830 Muscle spasm of back: Secondary | ICD-10-CM | POA: Diagnosis not present

## 2021-01-02 DIAGNOSIS — M9900 Segmental and somatic dysfunction of head region: Secondary | ICD-10-CM | POA: Diagnosis not present

## 2021-01-02 DIAGNOSIS — M9901 Segmental and somatic dysfunction of cervical region: Secondary | ICD-10-CM | POA: Diagnosis not present

## 2021-01-02 DIAGNOSIS — M542 Cervicalgia: Secondary | ICD-10-CM | POA: Diagnosis not present

## 2021-01-02 DIAGNOSIS — M62830 Muscle spasm of back: Secondary | ICD-10-CM | POA: Diagnosis not present

## 2021-02-11 DIAGNOSIS — M62830 Muscle spasm of back: Secondary | ICD-10-CM | POA: Diagnosis not present

## 2021-02-11 DIAGNOSIS — M9901 Segmental and somatic dysfunction of cervical region: Secondary | ICD-10-CM | POA: Diagnosis not present

## 2021-02-11 DIAGNOSIS — M9900 Segmental and somatic dysfunction of head region: Secondary | ICD-10-CM | POA: Diagnosis not present

## 2021-02-11 DIAGNOSIS — M542 Cervicalgia: Secondary | ICD-10-CM | POA: Diagnosis not present

## 2021-02-12 DIAGNOSIS — M62830 Muscle spasm of back: Secondary | ICD-10-CM | POA: Diagnosis not present

## 2021-02-12 DIAGNOSIS — M9900 Segmental and somatic dysfunction of head region: Secondary | ICD-10-CM | POA: Diagnosis not present

## 2021-02-12 DIAGNOSIS — M542 Cervicalgia: Secondary | ICD-10-CM | POA: Diagnosis not present

## 2021-02-12 DIAGNOSIS — M9901 Segmental and somatic dysfunction of cervical region: Secondary | ICD-10-CM | POA: Diagnosis not present

## 2021-02-13 DIAGNOSIS — R519 Headache, unspecified: Secondary | ICD-10-CM | POA: Diagnosis not present

## 2021-02-28 DIAGNOSIS — E7800 Pure hypercholesterolemia, unspecified: Secondary | ICD-10-CM | POA: Diagnosis not present

## 2021-03-19 ENCOUNTER — Ambulatory Visit
Admission: RE | Admit: 2021-03-19 | Discharge: 2021-03-19 | Disposition: A | Discharge status: Home / Self Care | Payer: PPO | Source: Ambulatory Visit | Attending: Family Medicine | Admitting: Family Medicine

## 2021-03-19 DIAGNOSIS — Z780 Asymptomatic menopausal state: Secondary | ICD-10-CM | POA: Diagnosis not present

## 2021-03-19 DIAGNOSIS — E2839 Other primary ovarian failure: Secondary | ICD-10-CM

## 2021-03-19 DIAGNOSIS — M8589 Other specified disorders of bone density and structure, multiple sites: Secondary | ICD-10-CM | POA: Diagnosis not present

## 2021-03-20 ENCOUNTER — Other Ambulatory Visit: Payer: Self-pay | Admitting: Physician Assistant

## 2021-03-20 DIAGNOSIS — R109 Unspecified abdominal pain: Secondary | ICD-10-CM | POA: Diagnosis not present

## 2021-03-20 DIAGNOSIS — R1012 Left upper quadrant pain: Secondary | ICD-10-CM | POA: Diagnosis not present

## 2021-03-20 DIAGNOSIS — K8020 Calculus of gallbladder without cholecystitis without obstruction: Secondary | ICD-10-CM | POA: Diagnosis not present

## 2021-03-20 DIAGNOSIS — R1032 Left lower quadrant pain: Secondary | ICD-10-CM | POA: Diagnosis not present

## 2021-03-20 DIAGNOSIS — R634 Abnormal weight loss: Secondary | ICD-10-CM | POA: Diagnosis not present

## 2021-04-08 ENCOUNTER — Ambulatory Visit
Admission: RE | Admit: 2021-04-08 | Discharge: 2021-04-08 | Disposition: A | Discharge status: Home / Self Care | Payer: PPO | Source: Ambulatory Visit | Attending: Physician Assistant | Admitting: Physician Assistant

## 2021-04-08 ENCOUNTER — Other Ambulatory Visit: Payer: Self-pay

## 2021-04-08 DIAGNOSIS — K5730 Diverticulosis of large intestine without perforation or abscess without bleeding: Secondary | ICD-10-CM | POA: Diagnosis not present

## 2021-04-08 DIAGNOSIS — N200 Calculus of kidney: Secondary | ICD-10-CM | POA: Diagnosis not present

## 2021-04-08 DIAGNOSIS — K8020 Calculus of gallbladder without cholecystitis without obstruction: Secondary | ICD-10-CM | POA: Diagnosis not present

## 2021-04-08 DIAGNOSIS — K6389 Other specified diseases of intestine: Secondary | ICD-10-CM | POA: Diagnosis not present

## 2021-04-08 DIAGNOSIS — R109 Unspecified abdominal pain: Secondary | ICD-10-CM

## 2021-04-08 MED ORDER — IOPAMIDOL (ISOVUE-300) INJECTION 61%
100.0000 mL | Freq: Once | INTRAVENOUS | Status: AC | PRN
  Administered 2021-04-08: 100 mL via INTRAVENOUS

## 2021-05-16 DIAGNOSIS — K589 Irritable bowel syndrome without diarrhea: Secondary | ICD-10-CM | POA: Diagnosis not present

## 2021-05-16 DIAGNOSIS — Z86010 Personal history of colonic polyps: Secondary | ICD-10-CM | POA: Diagnosis not present

## 2021-05-16 DIAGNOSIS — K8020 Calculus of gallbladder without cholecystitis without obstruction: Secondary | ICD-10-CM | POA: Diagnosis not present

## 2021-09-05 DIAGNOSIS — E782 Mixed hyperlipidemia: Secondary | ICD-10-CM | POA: Diagnosis not present

## 2021-09-05 DIAGNOSIS — E039 Hypothyroidism, unspecified: Secondary | ICD-10-CM | POA: Diagnosis not present

## 2021-09-10 DIAGNOSIS — E2839 Other primary ovarian failure: Secondary | ICD-10-CM | POA: Diagnosis not present

## 2021-09-10 DIAGNOSIS — Z0000 Encounter for general adult medical examination without abnormal findings: Secondary | ICD-10-CM | POA: Diagnosis not present

## 2021-09-10 DIAGNOSIS — M810 Age-related osteoporosis without current pathological fracture: Secondary | ICD-10-CM | POA: Diagnosis not present

## 2021-09-10 DIAGNOSIS — Z1331 Encounter for screening for depression: Secondary | ICD-10-CM | POA: Diagnosis not present

## 2021-09-10 DIAGNOSIS — E782 Mixed hyperlipidemia: Secondary | ICD-10-CM | POA: Diagnosis not present

## 2021-09-10 DIAGNOSIS — E039 Hypothyroidism, unspecified: Secondary | ICD-10-CM | POA: Diagnosis not present

## 2021-09-10 DIAGNOSIS — J45909 Unspecified asthma, uncomplicated: Secondary | ICD-10-CM | POA: Diagnosis not present

## 2022-05-13 ENCOUNTER — Emergency Department (HOSPITAL_COMMUNITY)
Admission: EM | Admit: 2022-05-13 | Discharge: 2022-05-13 | Disposition: A | Discharge status: Home / Self Care | Payer: PPO | Attending: Emergency Medicine | Admitting: Emergency Medicine

## 2022-05-13 ENCOUNTER — Encounter (HOSPITAL_COMMUNITY): Payer: Self-pay

## 2022-05-13 ENCOUNTER — Emergency Department (HOSPITAL_COMMUNITY): Payer: PPO

## 2022-05-13 ENCOUNTER — Other Ambulatory Visit: Payer: Self-pay

## 2022-05-13 DIAGNOSIS — S9032XA Contusion of left foot, initial encounter: Secondary | ICD-10-CM | POA: Diagnosis not present

## 2022-05-13 DIAGNOSIS — R61 Generalized hyperhidrosis: Secondary | ICD-10-CM | POA: Diagnosis not present

## 2022-05-13 DIAGNOSIS — S0083XA Contusion of other part of head, initial encounter: Secondary | ICD-10-CM | POA: Diagnosis not present

## 2022-05-13 DIAGNOSIS — Y92007 Garden or yard of unspecified non-institutional (private) residence as the place of occurrence of the external cause: Secondary | ICD-10-CM | POA: Diagnosis not present

## 2022-05-13 DIAGNOSIS — S60221A Contusion of right hand, initial encounter: Secondary | ICD-10-CM | POA: Diagnosis not present

## 2022-05-13 DIAGNOSIS — S0031XA Abrasion of nose, initial encounter: Secondary | ICD-10-CM | POA: Insufficient documentation

## 2022-05-13 DIAGNOSIS — W0110XA Fall on same level from slipping, tripping and stumbling with subsequent striking against unspecified object, initial encounter: Secondary | ICD-10-CM | POA: Insufficient documentation

## 2022-05-13 DIAGNOSIS — Z043 Encounter for examination and observation following other accident: Secondary | ICD-10-CM | POA: Diagnosis not present

## 2022-05-13 DIAGNOSIS — S99922A Unspecified injury of left foot, initial encounter: Secondary | ICD-10-CM | POA: Diagnosis present

## 2022-05-13 MED ORDER — OXYCODONE-ACETAMINOPHEN 5-325 MG PO TABS: 2.0000 | ORAL_TABLET | Freq: Once | ORAL | Status: DC

## 2022-05-13 NOTE — ED Provider Notes (Signed)
[place] EMERGENCY DEPARTMENT AT [facility] Provider Note   CSN: [id number] Arrival date & time: 05/13/22  1609     History  Chief Complaint  Patient presents with   foot impalement    Katherine Morton is a 72 y.o. female here presenting with foot injury.  Patient states that she tripped and fell in the garden.  She states that her left foot got caught in a metal stick and she felt that it went through her foot.  Family was unable to barb wire off and she was transported to the ED.  She states that she did hit her face as well.  Her tetanus is up-to-date.  The history is provided by the patient.       Home Medications Prior to Admission medications   Medication Sig Start Date End Date Taking? Authorizing Provider  ARMOUR THYROID PO Take by mouth.    [provider]  B COMPLEX-C PO Take by mouth.    [provider]  BLACK CURRANT SEED OIL PO Take by mouth.    [provider]  Cholecalciferol (VITAMIN D PO) Take by mouth daily.      [provider]  COD LIVER OIL PO Take by mouth.    [provider]  GARLIC PO Take by mouth.    [provider]  Multiple Vitamin (MULTIVITAMIN) capsule Take 1 capsule by mouth daily.      [provider]  NEOMYCIN-POLYMYXIN-HYDROCORTISONE (CORTISPORIN) 1 % SOLN OTIC solution Apply 1-2 drops to toe BID after soaking 06/02/19   Hyatt, Max T, DPM  Povidone K-30 POWD by Does not apply route.    [provider]  ZINC ACETATE EX Apply topically.    [provider]      Allergies    Ciprofloxacin hcl, Codeine, Maxalt [rizatriptan benzoate], and Penicillins    Review of Systems   Review of Systems  Skin:        Left foot pain  All other systems reviewed and are negative.   Physical Exam Updated Vital Signs BP 121/73   Pulse 75   Temp (!) 97.5 F (36.4 C) (Oral)   Resp 20   Ht 5\' 5"  (1.651 m)   Wt 68 kg   SpO2 100%   BMI 24.95 kg/m  Physical  Exam Vitals and nursing note reviewed.  Constitutional:      Appearance: Normal appearance.  HENT:     Head: Normocephalic.     Nose:     Comments: Abrasion at the bridge of the nose Eyes:     Extraocular Movements: Extraocular movements intact.     Pupils: Pupils are equal, round, and reactive to light.  Cardiovascular:     Rate and Rhythm: Normal rate and regular rhythm.     Pulses: Normal pulses.     Heart sounds: Normal heart sounds.  Pulmonary:     Effort: Pulmonary effort is normal.     Breath sounds: Normal breath sounds.  Abdominal:     General: Abdomen is flat.     Palpations: Abdomen is soft.  Musculoskeletal:     Cervical back: Normal range of motion and neck supple.     Comments: Left foot with metal stake through it.  I was able to cut the shoe and the metal stake actually went around the toes.  There is no skin tear or toe injury   Neurological:     Mental Status: She is alert.  ED Results / Procedures / Treatments   Labs (all labs ordered are listed, but only abnormal results are displayed) Labs Reviewed - No data to display  EKG None  Radiology DG Hand Complete Right  Result Date: 05/13/2022 CLINICAL DATA:  Pt arrived via [facility] from home. Pt was gardening outside and tripped and fell and her left foot got impaled on rebar that was in the garden. Pt has metal sticking out of left foot EXAM: RIGHT HAND - COMPLETE 3+ VIEW COMPARISON:  Right finger 06/09/2003 FINDINGS: There is no evidence of fracture or dislocation. Interval worsening of distal interphalangeal joint degenerative changes. First carpometacarpal joint degenerative changes. Soft tissues are unremarkable. IMPRESSION: No acute displaced fracture or dislocation. Electronically Signed   By: Tish Frederickson M.D.   On: 05/13/2022 17:01   DG Foot Complete Left  Result Date: 05/13/2022 CLINICAL DATA:  Tripped and fell. Left foot was impaled on rebar that was in the garden. Patient haws metal sticking  out of foot. EXAM: LEFT FOOT - COMPLETE 3+ VIEW COMPARISON:  None Available. FINDINGS: Normal bone mineralization. Severe great toe metatarsophalangeal joint space narrowing and peripheral osteophytosis. Small plantar and tiny posterior calcaneal heel spurs. Mild dorsal talonavicular degenerative osteophytosis. No acute fracture is seen. No dislocation. No radiopaque foreign body is seen. IMPRESSION: 1. No acute fracture. 2. Severe great toe metatarsophalangeal joint osteoarthritis. 3. No radiopaque foreign body is seen. The prior reported rebar sticking out of the foot apparently has been removed. Electronically Signed   By: Neita Garnet M.D.   On: 05/13/2022 17:00    Procedures Procedures    Medications Ordered in ED Medications  oxyCODONE-acetaminophen (PERCOCET/ROXICET) 5-325 MG per tablet 2 tablet (2 tablets Oral Patient Refused/Not Given 05/13/22 1642)    ED Course/ Medical Decision Making/ A&P                             Medical Decision Making Katherine Morton is a 72 y.o. female with fall with possible foot injury.  Initial concern was possible metal barb through the foot.  7:31 PM X-rays show no fracture.  CT head and face and neck showed no fracture or bleeding.  Patient is stable for discharge.   Problems Addressed: Contusion of face, initial encounter: acute illness or injury Contusion of left foot, initial encounter: acute illness or injury Contusion of right hand, initial encounter: acute illness or injury  Amount and/or Complexity of Data Reviewed Radiology: ordered and independent interpretation performed. Decision-making details documented in ED Course.  Risk Prescription drug management.    Final Clinical Impression(s) / ED Diagnoses Final diagnoses:  None    Rx / DC Orders ED Discharge Orders     None         Charlynne Pander, MD 05/13/22 1932

## 2022-05-13 NOTE — Discharge Instructions (Signed)
As we discussed, your CT scan and x-rays did not show any fractures.  Take Tylenol or Motrin for pain  See your doctor for follow-up  Return to ER if you have another fall, severe pain

## 2022-05-13 NOTE — ED Triage Notes (Signed)
Pt arrived via [facility] from home. Pt was gardening outside and tripped and fell and her left foot got impaled on rebar that was in the garden. Pt has metal sticking out of left foot. Pt has 2+ left pedal pulse, cap refill less than 3 sec, warm to touch, sensation intact. EMS gave fentanyl IV and zofran 4mg  IV. Pt is A&Ox4.

## 2022-08-14 IMAGING — CT CT ABD-PELV W/ CM
1 of 3 series · 12 of 32 positions shown, 18 images · IV contrast (APPLIED)
Comparison: 02/07/2019

CLINICAL DATA: Right upper and left lower quadrant pain for 4
months

EXAM:
CT ABDOMEN AND PELVIS WITH CONTRAST
TECHNIQUE: Multidetector CT imaging of the abdomen and pelvis was performed
using the standard protocol following bolus administration of
intravenous contrast.

[Series 2: abd/pelvis w/cm · axial · 0.72mm/px · z∈[+766,+1150]mm · 12 of 91 slices shown, 18 images]
[im 7/91  soft-tissue]
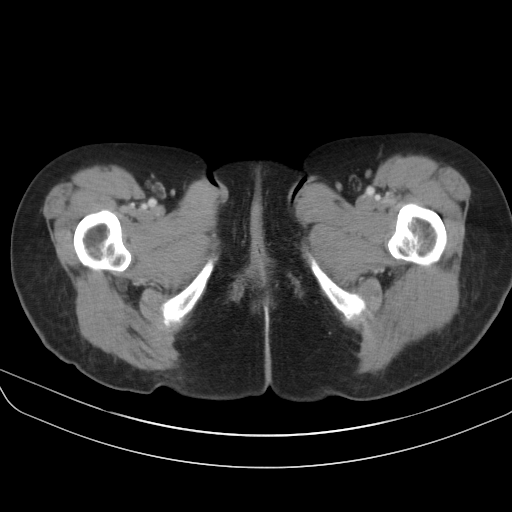
[im 7/91  bone]
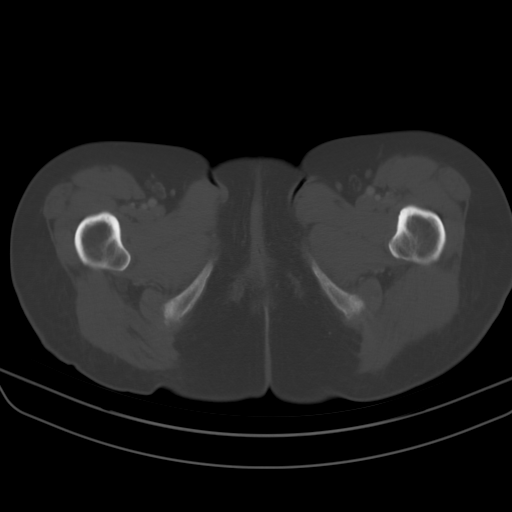
[im 14/91  soft-tissue]
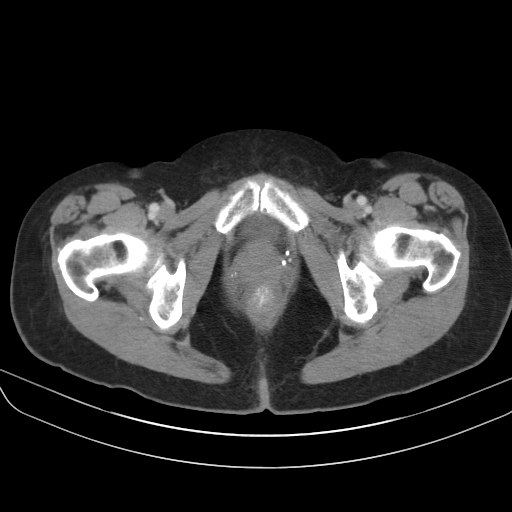
[im 21/91  soft-tissue]
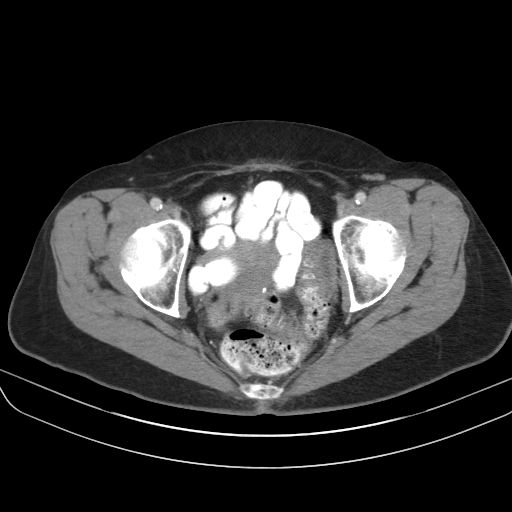
[im 28/91  soft-tissue]
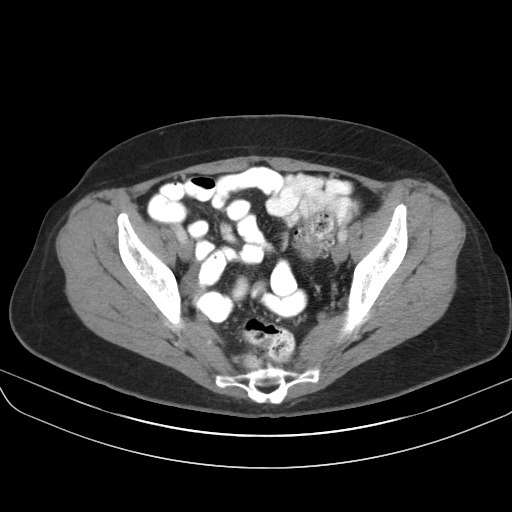
[im 35/91  soft-tissue]
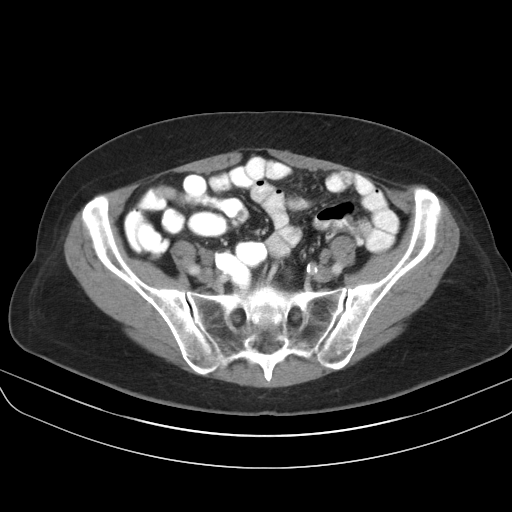
[im 42/91  soft-tissue]
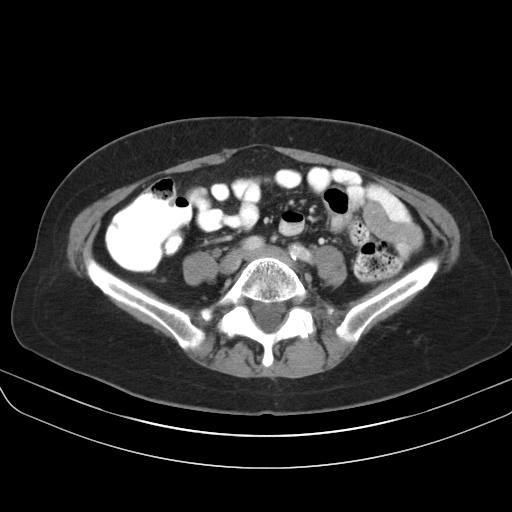
[im 49/91  soft-tissue]
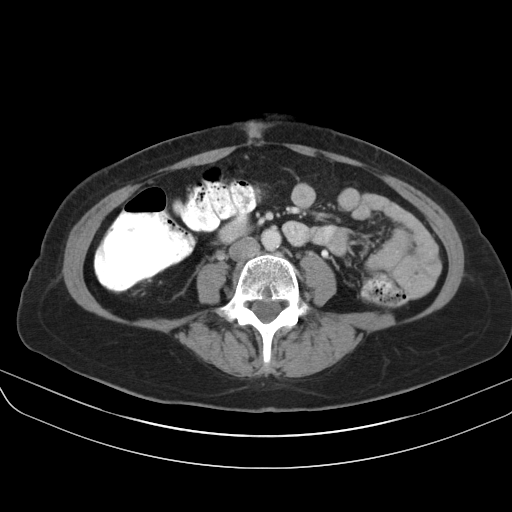
[im 56/91  soft-tissue]
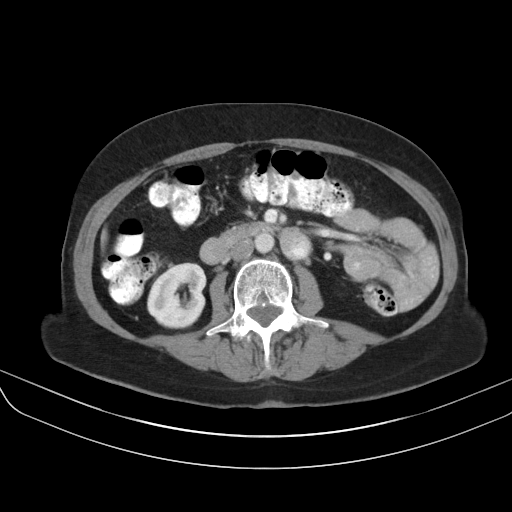
[im 63/91  soft-tissue]
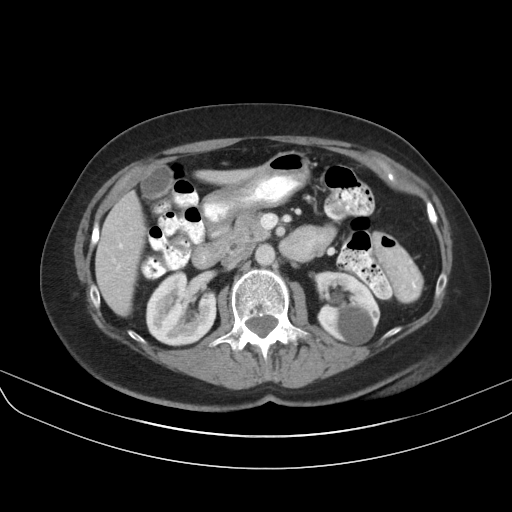
[im 63/91  lung]
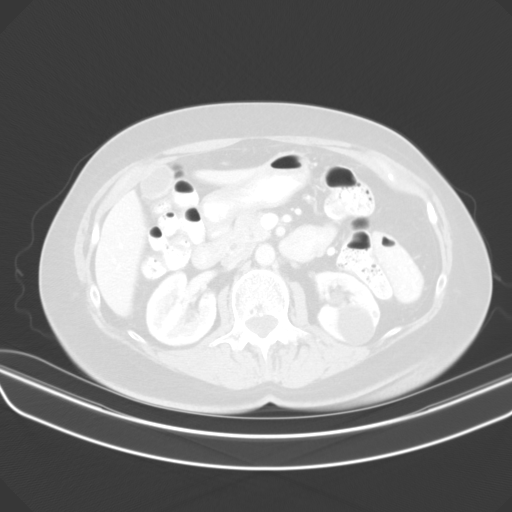
[im 63/91  bone]
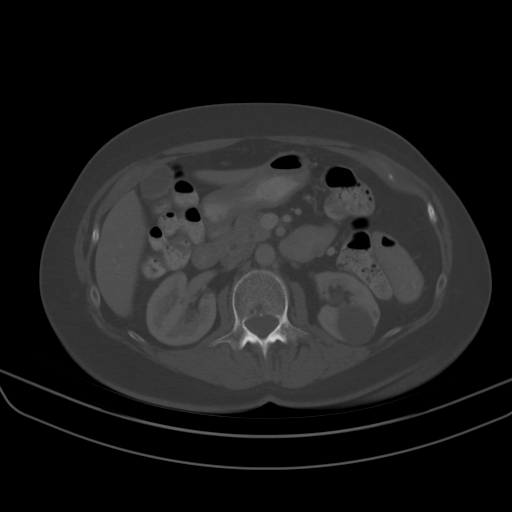
[im 70/91  soft-tissue]
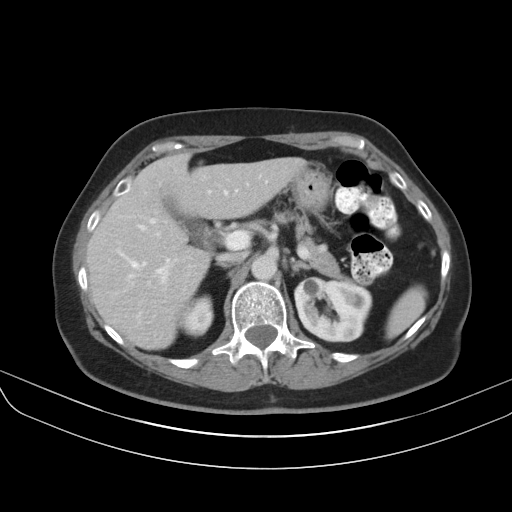
[im 70/91  lung]
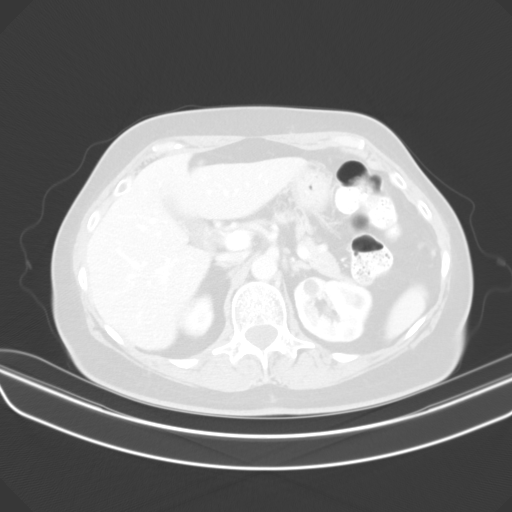
[im 77/91  soft-tissue]
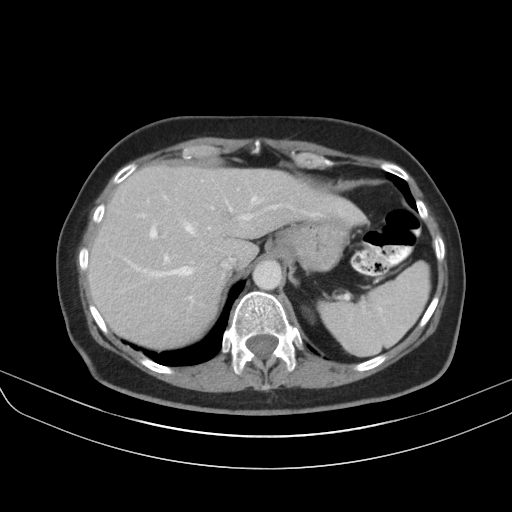
[im 77/91  lung]
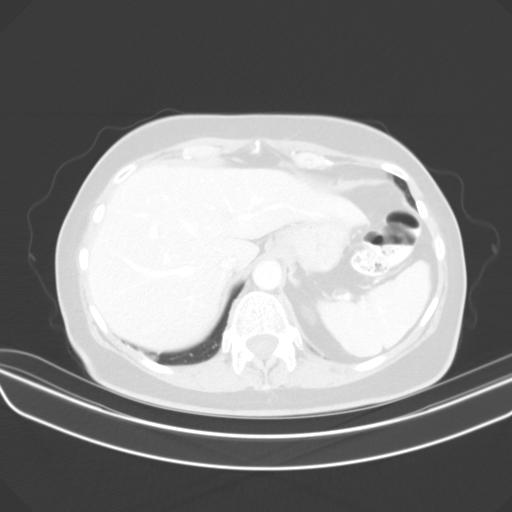
[im 84/91  soft-tissue]
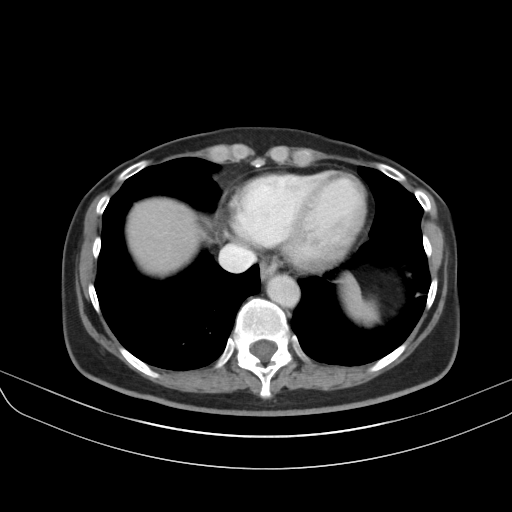
[im 84/91  lung]
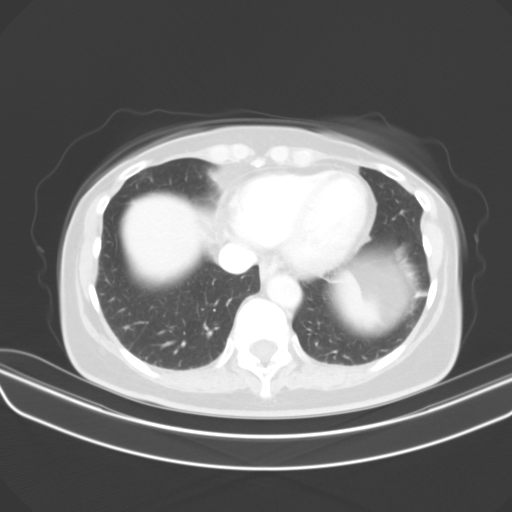

[12 of 32 positions shown; findings below may reference images not displayed]

RADIATION DOSE REDUCTION: This exam was performed according to the
departmental dose-optimization program which includes automated
exposure control, adjustment of the mA and/or kV according to
patient size and/or use of iterative reconstruction technique.

CONTRAST:  100mL RAV8QT-DZZ IOPAMIDOL (RAV8QT-DZZ) INJECTION 61%
FINDINGS: Lower chest: No acute pleural or parenchymal lung disease.

Hepatobiliary: Stable cholelithiasis. No evidence of acute
cholecystitis. The liver is unremarkable. No biliary duct dilation.

Pancreas: Unremarkable. No pancreatic ductal dilatation or
surrounding inflammatory changes.

Spleen: Normal in size without focal abnormality.

Adrenals/Urinary Tract: Stable bilateral benign cortical and
peripelvic cysts, which do not require follow-up. Stable
nonobstructing bilateral renal calculi, measuring up to 4 mm on the
left and 2 mm on the right. No obstructive uropathy within either
kidney. Bladder is minimally distended, without focal abnormality.
The adrenals are unremarkable.

Stomach/Bowel: No bowel obstruction or ileus. Normal appendix right
lower quadrant. Diverticulosis of the sigmoid colon, with chronic
areas of sigmoid wall thickening consistent with scarring. No acute
inflammatory changes to suggest active diverticulitis.

Vascular/Lymphatic: Aortic atherosclerosis. No enlarged abdominal or
pelvic lymph nodes.

Reproductive: Uterus and bilateral adnexa are unremarkable.

Other: No free fluid or free intraperitoneal gas. No abdominal wall
hernia.

Musculoskeletal: No acute or destructive bony lesions. Reconstructed
images demonstrate no additional findings.
IMPRESSION: 1. Cholelithiasis without cholecystitis.
2. Stable bilateral nonobstructing renal calculi, measuring up to 4
mm in size.
3. Sigmoid diverticulosis without evidence of acute diverticulitis.
Chronic sigmoid colon wall thickening consistent with scarring from
previous bouts of diverticulitis.
4.  Aortic Atherosclerosis ([id number]).

## 2022-10-13 DIAGNOSIS — E039 Hypothyroidism, unspecified: Secondary | ICD-10-CM | POA: Diagnosis not present

## 2022-10-13 DIAGNOSIS — E782 Mixed hyperlipidemia: Secondary | ICD-10-CM | POA: Diagnosis not present

## 2022-10-13 DIAGNOSIS — M810 Age-related osteoporosis without current pathological fracture: Secondary | ICD-10-CM | POA: Diagnosis not present

## 2022-10-13 DIAGNOSIS — F419 Anxiety disorder, unspecified: Secondary | ICD-10-CM | POA: Diagnosis not present

## 2022-10-13 DIAGNOSIS — G44209 Tension-type headache, unspecified, not intractable: Secondary | ICD-10-CM | POA: Diagnosis not present

## 2022-10-13 DIAGNOSIS — J45909 Unspecified asthma, uncomplicated: Secondary | ICD-10-CM | POA: Diagnosis not present

## 2022-10-13 DIAGNOSIS — E2839 Other primary ovarian failure: Secondary | ICD-10-CM | POA: Diagnosis not present

## 2022-10-24 ENCOUNTER — Other Ambulatory Visit (HOSPITAL_BASED_OUTPATIENT_CLINIC_OR_DEPARTMENT_OTHER): Payer: Self-pay | Admitting: Family Medicine

## 2022-10-24 DIAGNOSIS — E782 Mixed hyperlipidemia: Secondary | ICD-10-CM

## 2022-12-01 ENCOUNTER — Encounter (HOSPITAL_COMMUNITY): Payer: Self-pay

## 2022-12-01 ENCOUNTER — Ambulatory Visit (HOSPITAL_COMMUNITY): Payer: PPO

## 2024-02-02 ENCOUNTER — Other Ambulatory Visit: Payer: Self-pay | Admitting: *Deleted

## 2024-02-02 DIAGNOSIS — E063 Autoimmune thyroiditis: Secondary | ICD-10-CM
